# Patient Record
Sex: Female | Born: 1985 | Race: Black or African American | Hispanic: No | Marital: Married | Smoking: Never smoker
Health system: Southern US, Community
[De-identification: ages and names within clinical notes are randomized; demographics above are authoritative.]

## PROBLEM LIST (undated history)

## (undated) DIAGNOSIS — F32A Depression, unspecified: Secondary | ICD-10-CM

## (undated) DIAGNOSIS — E669 Obesity, unspecified: Secondary | ICD-10-CM

## (undated) DIAGNOSIS — B977 Papillomavirus as the cause of diseases classified elsewhere: Secondary | ICD-10-CM

## (undated) DIAGNOSIS — D649 Anemia, unspecified: Secondary | ICD-10-CM

## (undated) DIAGNOSIS — K219 Gastro-esophageal reflux disease without esophagitis: Secondary | ICD-10-CM

## (undated) DIAGNOSIS — E010 Iodine-deficiency related diffuse (endemic) goiter: Secondary | ICD-10-CM

## (undated) DIAGNOSIS — E039 Hypothyroidism, unspecified: Secondary | ICD-10-CM

## (undated) DIAGNOSIS — R87629 Unspecified abnormal cytological findings in specimens from vagina: Secondary | ICD-10-CM

## (undated) DIAGNOSIS — F419 Anxiety disorder, unspecified: Secondary | ICD-10-CM

## (undated) DIAGNOSIS — Z789 Other specified health status: Secondary | ICD-10-CM

## (undated) HISTORY — DX: Anxiety disorder, unspecified: F41.9

## (undated) HISTORY — DX: Gastro-esophageal reflux disease without esophagitis: K21.9

## (undated) HISTORY — DX: Depression, unspecified: F32.A

## (undated) HISTORY — PX: TUBAL LIGATION: SHX77

---

## 2015-04-25 ENCOUNTER — Inpatient Hospital Stay (HOSPITAL_COMMUNITY)
Admission: AD | Admit: 2015-04-25 | Discharge: 2015-04-26 | Disposition: A | Discharge status: Home / Self Care | Payer: Managed Care, Other (non HMO) | Source: Ambulatory Visit | Attending: Family Medicine | Admitting: Family Medicine

## 2015-04-25 ENCOUNTER — Encounter (HOSPITAL_COMMUNITY): Payer: Self-pay

## 2015-04-25 DIAGNOSIS — Z3202 Encounter for pregnancy test, result negative: Secondary | ICD-10-CM | POA: Insufficient documentation

## 2015-04-25 DIAGNOSIS — S46912A Strain of unspecified muscle, fascia and tendon at shoulder and upper arm level, left arm, initial encounter: Secondary | ICD-10-CM | POA: Diagnosis not present

## 2015-04-25 DIAGNOSIS — A084 Viral intestinal infection, unspecified: Secondary | ICD-10-CM | POA: Diagnosis not present

## 2015-04-25 DIAGNOSIS — R112 Nausea with vomiting, unspecified: Secondary | ICD-10-CM | POA: Diagnosis present

## 2015-04-25 HISTORY — DX: Other specified health status: Z78.9

## 2015-04-25 LAB — URINALYSIS, ROUTINE W REFLEX MICROSCOPIC
BILIRUBIN URINE: NEGATIVE
GLUCOSE, UA: NEGATIVE mg/dL
HGB URINE DIPSTICK: NEGATIVE
KETONES UR: NEGATIVE mg/dL
Nitrite: NEGATIVE
PROTEIN: NEGATIVE mg/dL
Specific Gravity, Urine: 1.02 (ref 1.005–1.030)
pH: 6 (ref 5.0–8.0)

## 2015-04-25 LAB — URINE MICROSCOPIC-ADD ON: RBC / HPF: NONE SEEN RBC/hpf (ref 0–5)

## 2015-04-25 LAB — POCT PREGNANCY, URINE: Preg Test, Ur: NEGATIVE

## 2015-04-25 MED ORDER — ONDANSETRON 8 MG PO TBDP
8.0000 mg | ORAL_TABLET | Freq: Once | ORAL | Status: AC
  Administered 2015-04-25: 8 mg via ORAL
  Filled 2015-04-25: qty 1

## 2015-04-25 MED ORDER — ONDANSETRON HCL 4 MG PO TABS: 4.0000 mg | ORAL_TABLET | Freq: Four times a day (QID) | ORAL | Status: DC

## 2015-04-25 NOTE — MAU Note (Signed)
Pt reports nausea and vomiting today, diarrhea last pm. Also reports her left shoulder was numb earlier today.

## 2015-04-25 NOTE — MAU Provider Note (Signed)
History     CSN: [id number]  Arrival date and time: 04/25/15 2250   First Provider Initiated Contact with Patient 04/25/15 2327      Chief Complaint  Patient presents with  . Nausea  . Emesis   HPI Ms. Kimberly Dennis is a 30 y.o. G2P1011 who presents to MAU today with complaint of N/V/D since last night. She states last loose stool was this morning and last emesis around 1800 today. She has not eaten since then. She denies abdominal pain or vaginal bleeding. She denies fever or sick contacts. She also complains of mild left shoulder pain since this morning. She has not taken anything for pain. She rates pain at 3/10 now.   OB History    Gravida Para Term Preterm AB TAB SAB Ectopic Multiple Living   2 1 1  1  1   1       Past Medical History  Diagnosis Date  . Medical history non-contributory     Past Surgical History  Procedure Laterality Date  . Cesarean section      No family history on file.  Social History  Substance Use Topics  . Smoking status: None  . Smokeless tobacco: None  . Alcohol Use: None    Allergies: No Known Allergies  No prescriptions prior to admission    Review of Systems  Constitutional: Negative for fever and malaise/fatigue.  Gastrointestinal: Positive for nausea, vomiting and diarrhea. Negative for abdominal pain and constipation.  Genitourinary: Negative for dysuria, urgency and frequency.       Neg - vaginal bleeding  Musculoskeletal: Positive for myalgias.   Physical Exam   Blood pressure 133/81, pulse 99, temperature 99.1 F (37.3 C), temperature source Oral, resp. rate 18, height 5\' 8"  (1.727 m), weight 267 lb (121.11 kg), last menstrual period 04/15/2015, SpO2 100 %.  Physical Exam  Nursing note and vitals reviewed. Constitutional: She is oriented to person, place, and time. She appears well-developed and well-nourished. No distress.  HENT:  Head: Normocephalic and atraumatic.  Cardiovascular: Normal rate.   Respiratory:  Effort normal.  GI: Soft. She exhibits no distension.  Musculoskeletal:       Left shoulder: She exhibits tenderness (mild tenderness to palpation over the bicipital groove). She exhibits normal range of motion, no bony tenderness, no swelling, no effusion, no crepitus, no deformity, no pain and no spasm.  Neurological: She is alert and oriented to person, place, and time.  Skin: Skin is warm and dry. No erythema.  Psychiatric: She has a normal mood and affect.    Results for orders placed or performed during the hospital encounter of 04/25/15 (from the past 24 hour(s))  Urinalysis, Routine w reflex microscopic (not at [facility])     Status: Abnormal   Collection Time: 04/25/15 11:10 PM  Result Value Ref Range   Color, Urine YELLOW YELLOW   APPearance CLEAR CLEAR   Specific Gravity, Urine 1.020 1.005 - 1.030   pH 6.0 5.0 - 8.0   Glucose, UA NEGATIVE NEGATIVE mg/dL   Hgb urine dipstick NEGATIVE NEGATIVE   Bilirubin Urine NEGATIVE NEGATIVE   Ketones, ur NEGATIVE NEGATIVE mg/dL   Protein, ur NEGATIVE NEGATIVE mg/dL   Nitrite NEGATIVE NEGATIVE   Leukocytes, UA SMALL (A) NEGATIVE  Urine microscopic-add on     Status: Abnormal   Collection Time: 04/25/15 11:10 PM  Result Value Ref Range   Squamous Epithelial / LPF 6-30 (A) NONE SEEN   WBC, UA 0-5 0 - 5 WBC/hpf  RBC / HPF NONE SEEN 0 - 5 RBC/hpf   Bacteria, UA MANY (A) NONE SEEN  Pregnancy, urine POC     Status: None   Collection Time: 04/25/15 11:20 PM  Result Value Ref Range   Preg Test, Ur NEGATIVE NEGATIVE    MAU Course  Procedures None  MDM UPT - negative UA today without evidence of dehydration ODT Zofran given in MAU Urine culture pending Assessment and Plan  A: Viral gastroenteritis Left shoulder strain  P: Discharge home Rx for Zofran given to patient Advised use of Ibuprofen, ice and rest for left shoulder pain. Follow-up with PCP if symptoms do not improve.  Warning signs for worsening condition  discussed Patient advised to follow-up with PCP of choice if symptoms persist or worsen Patient may return to MAU as needed or if her condition were to change or worsen   Marny LowensteinJulie N Wenzel, PA-C  04/25/2015, 11:39 PM

## 2015-04-25 NOTE — Discharge Instructions (Signed)
Food Choices to Help Relieve Diarrhea, Adult °When you have diarrhea, the foods you eat and your eating habits are very important. Choosing the right foods and drinks can help relieve diarrhea. Also, because diarrhea can last up to 7 days, you need to replace lost fluids and electrolytes (such as sodium, potassium, and chloride) in order to help prevent dehydration.  °WHAT GENERAL GUIDELINES DO I NEED TO FOLLOW? °· Slowly drink 1 cup (8 oz) of fluid for each episode of diarrhea. If you are getting enough fluid, your urine will be clear or pale yellow. °· Eat starchy foods. Some good choices include white rice, white toast, pasta, low-fiber cereal, baked potatoes (without the skin), saltine crackers, and bagels. °· Avoid large servings of any cooked vegetables. °· Limit fruit to two servings per day. A serving is ½ cup or 1 small piece. °· Choose foods with less than 2 g of fiber per serving. °· Limit fats to less than 8 tsp (38 g) per day. °· Avoid fried foods. °· Eat foods that have probiotics in them. Probiotics can be found in certain dairy products. °· Avoid foods and beverages that may increase the speed at which food moves through the stomach and intestines (gastrointestinal tract). Things to avoid include: °¨ High-fiber foods, such as dried fruit, raw fruits and vegetables, nuts, seeds, and whole grain foods. °¨ Spicy foods and high-fat foods. °¨ Foods and beverages sweetened with high-fructose corn syrup, honey, or sugar alcohols such as xylitol, sorbitol, and mannitol. °WHAT FOODS ARE RECOMMENDED? °Grains °White rice. White, French, or pita breads (fresh or toasted), including plain rolls, buns, or bagels. White pasta. Saltine, soda, or graham crackers. Pretzels. Low-fiber cereal. Cooked cereals made with water (such as cornmeal, farina, or cream cereals). Plain muffins. Matzo. Melba toast. Zwieback.  °Vegetables °Potatoes (without the skin). Strained tomato and vegetable juices. Most well-cooked and canned  vegetables without seeds. Tender lettuce. °Fruits °Cooked or canned applesauce, apricots, cherries, fruit cocktail, grapefruit, peaches, pears, or plums. Fresh bananas, apples without skin, cherries, grapes, cantaloupe, grapefruit, peaches, oranges, or plums.  °Meat and Other Protein Products °Baked or boiled chicken. Eggs. Tofu. Fish. Seafood. Smooth peanut butter. Ground or well-cooked tender beef, ham, veal, lamb, pork, or poultry.  °Dairy °Plain yogurt, kefir, and unsweetened liquid yogurt. Lactose-free milk, buttermilk, or soy milk. Plain hard cheese. °Beverages °Sport drinks. Clear broths. Diluted fruit juices (except prune). Regular, caffeine-free sodas such as ginger ale. Water. Decaffeinated teas. Oral rehydration solutions. Sugar-free beverages not sweetened with sugar alcohols. °Other °Bouillon, broth, or soups made from recommended foods.  °The items listed above may not be a complete list of recommended foods or beverages. Contact your dietitian for more options. °WHAT FOODS ARE NOT RECOMMENDED? °Grains °Whole grain, whole wheat, bran, or rye breads, rolls, pastas, crackers, and cereals. Wild or brown rice. Cereals that contain more than 2 g of fiber per serving. Corn tortillas or taco shells. Cooked or dry oatmeal. Granola. Popcorn. °Vegetables °Raw vegetables. Cabbage, broccoli, Brussels sprouts, artichokes, baked beans, beet greens, corn, kale, legumes, peas, sweet potatoes, and yams. Potato skins. Cooked spinach and cabbage. °Fruits °Dried fruit, including raisins and dates. Raw fruits. Stewed or dried prunes. Fresh apples with skin, apricots, mangoes, pears, raspberries, and strawberries.  °Meat and Other Protein Products °Chunky peanut butter. Nuts and seeds. Beans and lentils. Bacon.  °Dairy °High-fat cheeses. Milk, chocolate milk, and beverages made with milk, such as milk shakes. Cream. Ice cream. °Sweets and Desserts °Sweet rolls, doughnuts, and sweet breads.   Pancakes and waffles. Fats and  Oils Butter. Cream sauces. Margarine. Salad oils. Plain salad dressings. Olives. Avocados.  Beverages Caffeinated beverages (such as coffee, tea, soda, or energy drinks). Alcoholic beverages. Fruit juices with pulp. Prune juice. Soft drinks sweetened with high-fructose corn syrup or sugar alcohols. Other Coconut. Hot sauce. Chili powder. Mayonnaise. Gravy. Cream-based or milk-based soups.  The items listed above may not be a complete list of foods and beverages to avoid. Contact your dietitian for more information. WHAT SHOULD I DO IF I BECOME DEHYDRATED? Diarrhea can sometimes lead to dehydration. Signs of dehydration include dark urine and dry mouth and skin. If you think you are dehydrated, you should rehydrate with an oral rehydration solution. These solutions can be purchased at pharmacies, retail stores, or online.  Drink -1 cup (120-240 mL) of oral rehydration solution each time you have an episode of diarrhea. If drinking this amount makes your diarrhea worse, try drinking smaller amounts more often. For example, drink 1-3 tsp (5-15 mL) every 5-10 minutes.  A general rule for staying hydrated is to drink 1-2 L of fluid per day. Talk to your health care provider about the specific amount you should be drinking each day. Drink enough fluids to keep your urine clear or pale yellow.   This information is not intended to replace advice given to you by your health care provider. Make sure you discuss any questions you have with your health care provider.   Document Released: 06/30/2003 Document Revised: 04/30/2014 Document Reviewed: 03/02/2013 Elsevier Interactive Patient Education 2016 Elsevier Inc. Muscle Strain A muscle strain (pulled muscle) happens when a muscle is stretched beyond normal length. It happens when a sudden, violent force stretches your muscle too far. Usually, a few of the fibers in your muscle are torn. Muscle strain is common in athletes. Recovery usually takes 1-2 weeks.  Complete healing takes 5-6 weeks.  HOME CARE   Follow the PRICE method of treatment to help your injury get better. Do this the first 2-3 days after the injury:  Protect. Protect the muscle to keep it from getting injured again.  Rest. Limit your activity and rest the injured body part.  Ice. Put ice in a plastic bag. Place a towel between your skin and the bag. Then, apply the ice and leave it on from 15-20 minutes each hour. After the third day, switch to moist heat packs.  Compression. Use a splint or elastic bandage on the injured area for comfort. Do not put it on too tightly.  Elevate. Keep the injured body part above the level of your heart.  Only take medicine as told by your doctor.  Warm up before doing exercise to prevent future muscle strains. GET HELP IF:   You have more pain or puffiness (swelling) in the injured area.  You feel numbness, tingling, or notice a loss of strength in the injured area. MAKE SURE YOU:   Understand these instructions.  Will watch your condition.  Will get help right away if you are not doing well or get worse.   This information is not intended to replace advice given to you by your health care provider. Make sure you discuss any questions you have with your health care provider.   Document Released: 01/17/2008 Document Revised: 01/28/2013 Document Reviewed: 11/06/2012 Elsevier Interactive Patient Education [employer] Elsevier Inc.

## 2015-04-27 LAB — CULTURE, OB URINE

## 2015-05-25 ENCOUNTER — Ambulatory Visit (INDEPENDENT_AMBULATORY_CARE_PROVIDER_SITE_OTHER): Payer: Managed Care, Other (non HMO)

## 2015-05-25 ENCOUNTER — Ambulatory Visit (INDEPENDENT_AMBULATORY_CARE_PROVIDER_SITE_OTHER): Payer: Managed Care, Other (non HMO) | Admitting: Physician Assistant

## 2015-05-25 VITALS — BP 110/60 | HR 87 | Temp 99.4°F | Resp 18 | Ht 66.75 in | Wt 266.8 lb

## 2015-05-25 DIAGNOSIS — R05 Cough: Secondary | ICD-10-CM

## 2015-05-25 DIAGNOSIS — J111 Influenza due to unidentified influenza virus with other respiratory manifestations: Secondary | ICD-10-CM | POA: Diagnosis not present

## 2015-05-25 DIAGNOSIS — R059 Cough, unspecified: Secondary | ICD-10-CM

## 2015-05-25 DIAGNOSIS — R69 Illness, unspecified: Secondary | ICD-10-CM

## 2015-05-25 LAB — POCT CBC
Granulocyte percent: 72.7 %G (ref 37–80)
HEMATOCRIT: 36.5 % — AB (ref 37.7–47.9)
HEMOGLOBIN: 12 g/dL — AB (ref 12.2–16.2)
LYMPH, POC: 1.5 (ref 0.6–3.4)
MCH: 27.2 pg (ref 27–31.2)
MCHC: 32.9 g/dL (ref 31.8–35.4)
MCV: 82.5 fL (ref 80–97)
MID (cbc): 0.6 (ref 0–0.9)
MPV: 7 fL (ref 0–99.8)
POC GRANULOCYTE: 5.5 (ref 2–6.9)
POC LYMPH PERCENT: 19.7 %L (ref 10–50)
POC MID %: 7.6 %M (ref 0–12)
Platelet Count, POC: 201 10*3/uL (ref 142–424)
RBC: 4.42 M/uL (ref 4.04–5.48)
RDW, POC: 15.6 %
WBC: 7.6 10*3/uL (ref 4.6–10.2)

## 2015-05-25 MED ORDER — OSELTAMIVIR PHOSPHATE 75 MG PO CAPS: 75.0000 mg | ORAL_CAPSULE | Freq: Two times a day (BID) | ORAL | Status: DC

## 2015-05-25 MED ORDER — HYDROCODONE-HOMATROPINE 5-1.5 MG/5ML PO SYRP: 2.5000 mL | ORAL_SOLUTION | Freq: Every evening | ORAL | Status: DC | PRN

## 2015-05-25 MED ORDER — IBUPROFEN 600 MG PO TABS: 600.0000 mg | ORAL_TABLET | Freq: Three times a day (TID) | ORAL | Status: DC | PRN

## 2015-05-25 MED ORDER — LORATADINE-PSEUDOEPHEDRINE ER 5-120 MG PO TB12: 1.0000 | ORAL_TABLET | Freq: Two times a day (BID) | ORAL | Status: DC

## 2015-05-25 NOTE — Progress Notes (Signed)
05/26/2015 12:53 PM   DOB: 1985/12/18 / MRN: [id number]  SUBJECTIVE:  Kimberly Dennis is a 30 y.o. female presenting for productive cough and fever that started yesterday.  Associates some nasal congestion.  Has not tried any meds yet.  Reports that her mother has been recently diagnosed with pneumonia.    She has No Known Allergies.   She  has a past medical history of Medical history non-contributory.    She   She  has no sexual activity history on file. The patient  has past surgical history that includes Cesarean section.  Her family history is not on file.  Review of Systems  Constitutional: Positive for fever and chills. Negative for diaphoresis.  Respiratory: Positive for cough, hemoptysis, sputum production and shortness of breath. Negative for wheezing.   Cardiovascular: Negative for chest pain and leg swelling.  Gastrointestinal: Negative for nausea.  Genitourinary: Negative for dysuria.  Musculoskeletal: Negative for myalgias.  Skin: Negative for rash.  Neurological: Negative for dizziness and headaches.    Problem list and medications reviewed and updated by myself where necessary, and exist elsewhere in the encounter.   OBJECTIVE:  BP 110/60 mmHg  Pulse 87  Temp(Src) 99.4 F (37.4 C) (Oral)  Resp 18  Ht 5' 6.75" (1.695 m)  Wt 266 lb 12.8 oz (121.02 kg)  BMI 42.12 kg/m2  SpO2 98%  LMP 04/15/2015    Physical Exam  Constitutional: She is oriented to person, place, and time. She appears well-developed.  Eyes: EOM are normal. Pupils are equal, round, and reactive to light.  Cardiovascular: Normal rate.   Pulmonary/Chest: Effort normal. No respiratory distress. She has no wheezes. She has no rales. She exhibits no tenderness.  Abdominal: She exhibits no distension.  Musculoskeletal: Normal range of motion.  Neurological: She is alert and oriented to person, place, and time. No cranial nerve deficit.  Skin: Skin is warm and dry. She is not diaphoretic.    Psychiatric: She has a normal mood and affect.  Vitals reviewed.   Results for orders placed or performed in visit on 05/25/15 (from the past 72 hour(s))  POCT CBC     Status: Abnormal   Collection Time: 05/25/15  5:34 PM  Result Value Ref Range   WBC 7.6 4.6 - 10.2 K/uL   Lymph, poc 1.5 0.6 - 3.4   POC LYMPH PERCENT 19.7 10 - 50 %L   MID (cbc) 0.6 0 - 0.9   POC MID % 7.6 0 - 12 %M   POC Granulocyte 5.5 2 - 6.9   Granulocyte percent 72.7 37 - 80 %G   RBC 4.42 4.04 - 5.48 M/uL   Hemoglobin 12.0 (A) 12.2 - 16.2 g/dL   HCT, POC 95.6 (A) 21.3 - 47.9 %   MCV 82.5 80 - 97 fL   MCH, POC 27.2 27 - 31.2 pg   MCHC 32.9 31.8 - 35.4 g/dL   RDW, POC 08.6 %   Platelet Count, POC 201 142 - 424 K/uL   MPV 7.0 0 - 99.8 fL    Dg Chest 2 View  05/25/2015  CLINICAL DATA:  30 year old female with history of productive cough and fever since yesterday. Some nasal congestion. EXAM: CHEST  2 VIEW COMPARISON:  No priors. FINDINGS: Lung volumes are normal. No consolidative airspace disease. No pleural effusions. No pneumothorax. No pulmonary nodule or mass noted. Pulmonary vasculature and the cardiomediastinal silhouette are within normal limits. IMPRESSION: No radiographic evidence of acute cardiopulmonary disease. Electronically Signed  By: Trudie Reed M.D.   On: 05/25/2015 17:49    Assessment & Plan  Kimberly Dennis was seen today for cough, generalized body aches, chills and fever.  Diagnoses and all orders for this visit:  Cough -     DG Chest 2 View; Future -     POCT CBC -     HYDROcodone-homatropine (HYCODAN) 5-1.5 MG/5ML syrup; Take 2.5-5 mLs by mouth at bedtime as needed.  Influenza-like illness -     ibuprofen (ADVIL,MOTRIN) 600 MG tablet; Take 1 tablet (600 mg total) by mouth every 8 (eight) hours as needed. -     loratadine-pseudoephedrine (CLARITIN-D 12 HOUR) 5-120 MG tablet; Take 1 tablet by mouth 2 (two) times daily. -     oseltamivir (TAMIFLU) 75 MG capsule; Take 1 capsule (75 mg  total) by mouth 2 (two) times daily.  Other orders -     Discontinue: oseltamivir (TAMIFLU) 75 MG capsule; Take 1 capsule (75 mg total) by mouth 2 (two) times daily.    The patient was advised to call or return to clinic if she does not see an improvement in symptoms or to seek the care of the closest emergency department if she worsens with the above plan.   Deliah Boston, MHS, PA-C [facility] 05/26/2015 12:53 PM

## 2015-05-25 NOTE — Patient Instructions (Signed)
Because you received an x-ray today, you will receive an invoice from [facility]. Please contact [facility] at [phone] with questions or concerns regarding your invoice. Our billing staff will not be able to assist you with those questions. °

## 2017-07-02 ENCOUNTER — Other Ambulatory Visit (HOSPITAL_COMMUNITY): Payer: Self-pay | Admitting: General Surgery

## 2017-07-08 ENCOUNTER — Ambulatory Visit (HOSPITAL_COMMUNITY): Payer: Managed Care, Other (non HMO)

## 2017-07-11 ENCOUNTER — Encounter: Payer: Managed Care, Other (non HMO) | Attending: General Surgery | Admitting: Skilled Nursing Facility1

## 2017-07-11 ENCOUNTER — Encounter: Payer: Self-pay | Admitting: Skilled Nursing Facility1

## 2017-07-11 DIAGNOSIS — Z833 Family history of diabetes mellitus: Secondary | ICD-10-CM | POA: Diagnosis not present

## 2017-07-11 DIAGNOSIS — Z6841 Body Mass Index (BMI) 40.0 and over, adult: Secondary | ICD-10-CM | POA: Insufficient documentation

## 2017-07-11 DIAGNOSIS — Z713 Dietary counseling and surveillance: Secondary | ICD-10-CM | POA: Insufficient documentation

## 2017-07-11 DIAGNOSIS — Z8249 Family history of ischemic heart disease and other diseases of the circulatory system: Secondary | ICD-10-CM | POA: Diagnosis not present

## 2017-07-11 DIAGNOSIS — E669 Obesity, unspecified: Secondary | ICD-10-CM

## 2017-07-11 DIAGNOSIS — Z87891 Personal history of nicotine dependence: Secondary | ICD-10-CM | POA: Insufficient documentation

## 2017-07-11 NOTE — Progress Notes (Signed)
Pre-Op Assessment Visit:  Pre-Operative Sleeve Gastrectomy Surgery  Medical Nutrition Therapy:  Appt start time: 9:05  End time:  9:45  Patient was seen on 07/11/2017 for Pre-Operative Nutrition Assessment. Assessment and [facility] of [facility] coordinator on 07/11/2017.   Pt states she just wants To be healthy and not worry about weight. Pt states she works with [employer]. Pt states her younger sister had surgery. Pt understands she cannot gain weight due to her insurance coverage. Pt needs 3 SWL 4 total appointments.   Pt expectation of surgery: To be in pain, to lose weight quickly  Start weight at [facility]: 276.6 BMI: 45.19  24 hr Dietary Recall: First Meal: skipped Snack:  Second Meal 4pm: pizza Snack:  Third Meal 10:30pm: pasta Snack: salami and cheese Beverages: soda, water, juice, coffee with syrup and sugar  Encouraged to engage in 150 minutes of moderate physical activity including cardiovascular and weight baring weekly  Handouts given during visit include:  . Pre-Op Goals . Bariatric Surgery Protein Shakes During the appointment today the following Pre-Op Goals were reviewed with the patient: . Maintain or lose weight as instructed by your surgeon . Make healthy food choices . Begin to limit portion sizes . Limited concentrated sugars and fried foods . Keep fat/sugar in the single digits per serving on             food labels . Practice CHEWING your food  (aim for 30 chews per bite or until applesauce consistency) . Practice not drinking 15 minutes before, during, and 30 minutes after each meal/snack . Avoid all carbonated beverages  . Avoid/limit caffeinated beverages  . Avoid all sugar-sweetened beverages . Consume 3 meals per day; eat every 3-5 hours . Make a list of non-food related activities . Aim for 64-100 ounces of FLUID daily  . Aim for at least 60-80 grams of PROTEIN daily . Look for a liquid protein  source that contain ?15 g protein and ?5 g carbohydrate  (ex: shakes, drinks, shots)  -Follow diet recommendations listed below   Energy and Macronutrient Recomendations: Calories: 1500 Carbohydrate: 170 Protein: 112 Fat: 42  Demonstrated degree of understanding via:  Teach Back  Teaching Method Utilized:  Visual Auditory Hands on  Barriers to learning/adherence to lifestyle change: none identified   Patient to call the [facility] Education [facility] to enroll in Pre-Op and Post-Op Nutrition Education when surgery date is scheduled.

## 2017-07-12 ENCOUNTER — Ambulatory Visit (HOSPITAL_COMMUNITY)
Admission: RE | Admit: 2017-07-12 | Discharge: 2017-07-12 | Disposition: A | Discharge status: Home / Self Care | Payer: Managed Care, Other (non HMO) | Source: Ambulatory Visit | Attending: General Surgery | Admitting: General Surgery

## 2017-07-12 ENCOUNTER — Other Ambulatory Visit: Payer: Self-pay

## 2017-08-14 ENCOUNTER — Ambulatory Visit: Payer: Self-pay | Admitting: Skilled Nursing Facility1

## 2017-08-14 ENCOUNTER — Encounter: Payer: Self-pay | Admitting: Registered"

## 2017-08-14 ENCOUNTER — Encounter: Payer: Managed Care, Other (non HMO) | Attending: General Surgery | Admitting: Registered"

## 2017-08-14 DIAGNOSIS — Z833 Family history of diabetes mellitus: Secondary | ICD-10-CM | POA: Insufficient documentation

## 2017-08-14 DIAGNOSIS — E669 Obesity, unspecified: Secondary | ICD-10-CM

## 2017-08-14 DIAGNOSIS — Z713 Dietary counseling and surveillance: Secondary | ICD-10-CM | POA: Diagnosis not present

## 2017-08-14 DIAGNOSIS — Z8249 Family history of ischemic heart disease and other diseases of the circulatory system: Secondary | ICD-10-CM | POA: Diagnosis not present

## 2017-08-14 DIAGNOSIS — Z6841 Body Mass Index (BMI) 40.0 and over, adult: Secondary | ICD-10-CM | POA: Diagnosis not present

## 2017-08-14 DIAGNOSIS — Z87891 Personal history of nicotine dependence: Secondary | ICD-10-CM | POA: Diagnosis not present

## 2017-08-14 NOTE — Patient Instructions (Addendum)
-   Continue to work on chewing at least 30 times per bite.   - Aim to have breakfast option when waking up at 11 am such as egg and toast.   - Decrease juice intake and replace with water + flavor packs, gatorade zero, powerade zero, Nature's Twist, Vitamin Water zero, etc.  - Aim for at least 64 ounces a day.

## 2017-08-14 NOTE — Progress Notes (Signed)
Sleeve Gastrectomy Appt start time: 8:45 end time: 9:05  Assessment: 1st SWL Appointment.   Start Wt at [facility]: 276.6 Wt: 279.3 BMI: 45.63   Pt arrives having gained about 2.7 lbs from previous visit.   Pt states she has been able to eliminate soda (Coke, Pepsi) and caffeine intake. Pt states she is still working on chewing at least 30 times per bite. Pt states she is doing well with not drinking around meals. Pt states she wakes up at 6:30 am to take daughter to school and returns home to sleep again. Pt states she wakes up to start her day at 11am, works 12:30-9:15pm, eats dinner around 9:45pm. Pt states she typically goes to bed at 3am. 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    MEDICATIONS: See list   DIETARY INTAKE:  24-hr recall:  B ( AM): typically skips  Snk ( AM): none  L (4 PM): salad, pizza or veggie wrap Snk ( PM): none D (9:45 PM): steak, salad Snk ( PM): sometimes chips or popcorn Beverages: juice (48 oz), water (40 oz)  Usual physical activity: playing tennis 2 days/week, treadmill 30 min, 5 days/week  Diet to Follow: 1500 calories 170 g carbohydrates 112 g protein 42 g fat  Preferred Learning Style:   No preference indicated   Learning Readiness:   Ready  Change in progress    Nutritional Diagnosis:  [facility]-3.3 Overweight/obesity related to past poor dietary habits and physical inactivity as evidenced by patient w/ planned sleeve gastrectomy surgery following dietary guidelines for continued weight loss.    Intervention:  Nutrition counseling for upcoming Bariatric Surgery.  Goals:  - Aim for 150 minutes of physical activity including cardio and weight bearing every week - Continue to work on chewing at least 30 times per bite.  - Aim to have breakfast option when waking up at 11 am such as egg and toast.  - Decrease juice intake and replace with water + flavor packs, gatorade zero, powerade  zero, Nature's Twist, Vitamin Water zero, etc. - Aim for at least 64 ounces a day.   Teaching Method Utilized:  Visual Auditory Hands on  Handouts given during visit include:  none  Barriers to learning/adherence to lifestyle change: none identified  Demonstrated degree of understanding via:  Teach Back   Monitoring/Evaluation:  Dietary intake, exercise, and body weight in 1 month(s).

## 2017-08-28 ENCOUNTER — Other Ambulatory Visit (HOSPITAL_COMMUNITY)
Admission: RE | Admit: 2017-08-28 | Discharge: 2017-08-28 | Disposition: A | Discharge status: Home / Self Care | Payer: Managed Care, Other (non HMO) | Source: Ambulatory Visit | Attending: Family Medicine | Admitting: Family Medicine

## 2017-08-28 ENCOUNTER — Other Ambulatory Visit: Payer: Self-pay | Admitting: Family Medicine

## 2017-08-28 DIAGNOSIS — Z01419 Encounter for gynecological examination (general) (routine) without abnormal findings: Secondary | ICD-10-CM | POA: Diagnosis present

## 2017-08-30 LAB — CYTOLOGY - PAP
DIAGNOSIS: NEGATIVE
HPV (WINDOPATH): DETECTED — AB

## 2017-09-06 ENCOUNTER — Encounter: Payer: Managed Care, Other (non HMO) | Attending: General Surgery | Admitting: Registered"

## 2017-09-06 ENCOUNTER — Encounter: Payer: Self-pay | Admitting: Registered"

## 2017-09-06 DIAGNOSIS — E669 Obesity, unspecified: Secondary | ICD-10-CM

## 2017-09-06 DIAGNOSIS — Z833 Family history of diabetes mellitus: Secondary | ICD-10-CM | POA: Insufficient documentation

## 2017-09-06 DIAGNOSIS — Z8249 Family history of ischemic heart disease and other diseases of the circulatory system: Secondary | ICD-10-CM | POA: Insufficient documentation

## 2017-09-06 DIAGNOSIS — Z6841 Body Mass Index (BMI) 40.0 and over, adult: Secondary | ICD-10-CM | POA: Insufficient documentation

## 2017-09-06 DIAGNOSIS — Z87891 Personal history of nicotine dependence: Secondary | ICD-10-CM | POA: Insufficient documentation

## 2017-09-06 DIAGNOSIS — Z713 Dietary counseling and surveillance: Secondary | ICD-10-CM | POA: Diagnosis present

## 2017-09-06 NOTE — Patient Instructions (Addendum)
-   Continue to work on chewing well.   - Aim to not drink 15 minutes before eating, not while eating, and waiting 30 minutes after eating to drink.   - Continue to keep up the great work with other habits already established.

## 2017-09-06 NOTE — Progress Notes (Signed)
Sleeve Gastrectomy Appt start time: 9:25 end time: 9:37  Assessment: 2nd SWL Appointment.   Start Wt at [facility]: 276.6 Wt: 279.3 BMI: 45.63   Pt arrives having gained about 2.7 lbs from previous visit.   Pt states she is getting better with chewing; averaging 20-25 chews per bite. Pt states she has added breakfast option, now eating 3 meals a day. Pt states she tried Nature's Twist and it was too sweet. Pt states she is drinking Gatorade and water. Pt states she is drinking at least 64 ounces a day; no longer drinks juice. Pt states she likes the Muscle Milk light (vanilla flavor) and Premier Protein shakes (caramel and vanilla). Pt is doing well making behavioral changes.   Pt states she is doing well with not drinking around meals. Pt states she wakes up at 6:30 am to take daughter to school and returns home to sleep again. Pt states she wakes up to start her day at 11am, works 12:30-9:15pm, eats dinner around 9:45pm. Pt states she typically goes to bed at 3am. 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   Pt needs 3 SWL 4 total appointments.   MEDICATIONS: See list   DIETARY INTAKE:  24-hr recall:  B ( AM): wheat thins + egg/sausage Snk ( AM): none  L (4 PM): salad, pizza or veggie wrap Snk ( PM): protein shake D (9:45 PM): steak, salad Snk ( PM): sometimes chips or popcorn Beverages: Gatorade, Propel, water (40 oz)  Usual physical activity: playing tennis 2 days/week, treadmill 30 min, 5 days/week  Diet to Follow: 1500 calories 170 g carbohydrates 112 g protein 42 g fat  Preferred Learning Style:   No preference indicated   Learning Readiness:   Ready  Change in progress    Nutritional Diagnosis:  [place]-3.3 Overweight/obesity related to past poor dietary habits and physical inactivity as evidenced by patient w/ planned sleeve gastrectomy surgery following dietary guidelines for continued weight loss.     Intervention:  Nutrition counseling for upcoming Bariatric Surgery.  Goals:  - Aim for 150 minutes of physical activity including cardio and weight bearing every week - Continue to work on chewing well.  - Aim to not drink 15 minutes before eating, not while eating, and waiting 30 minutes after eating to drink.  - Continue to keep up the great work with other habits already established. day.   Teaching Method Utilized:  Visual Auditory Hands on  Handouts given during visit include:  none  Barriers to learning/adherence to lifestyle change: none identified  Demonstrated degree of understanding via:  Teach Back   Monitoring/Evaluation:  Dietary intake, exercise, and body weight in 1 month(s).

## 2017-10-04 ENCOUNTER — Ambulatory Visit: Payer: Self-pay | Admitting: Registered"

## 2017-10-09 ENCOUNTER — Encounter: Payer: Managed Care, Other (non HMO) | Attending: General Surgery | Admitting: Registered"

## 2017-10-09 ENCOUNTER — Encounter: Payer: Self-pay | Admitting: Registered"

## 2017-10-09 DIAGNOSIS — Z833 Family history of diabetes mellitus: Secondary | ICD-10-CM | POA: Insufficient documentation

## 2017-10-09 DIAGNOSIS — E669 Obesity, unspecified: Secondary | ICD-10-CM

## 2017-10-09 DIAGNOSIS — Z6841 Body Mass Index (BMI) 40.0 and over, adult: Secondary | ICD-10-CM | POA: Insufficient documentation

## 2017-10-09 DIAGNOSIS — Z87891 Personal history of nicotine dependence: Secondary | ICD-10-CM | POA: Diagnosis not present

## 2017-10-09 DIAGNOSIS — Z8249 Family history of ischemic heart disease and other diseases of the circulatory system: Secondary | ICD-10-CM | POA: Diagnosis not present

## 2017-10-09 DIAGNOSIS — Z713 Dietary counseling and surveillance: Secondary | ICD-10-CM | POA: Diagnosis not present

## 2017-10-09 NOTE — Progress Notes (Signed)
Sleeve Gastrectomy Appt start time: 8:00 End time: 8:18  Assessment: 3rd SWL Appointment.   Start Wt at [facility]: 276.6 Wt: 281.0 BMI: 45.91   Pt arrives having maintained weight from previous visit.   Pt states she has been stressed lately and has not been drinking as much as she used to; plan to get back on track with fluids. Pt states she is doing well with chewing 30 times per bite. Pt states she drinks protein shake every 2 hours because she feels she is not getting enough protein with food. Pt states her sister is main cook in the home and she is currently cooking vegan/pescatarian options. Pt states she is only off 2 days/week and able to cook her own food on those days only. Pt states she and sister live together. Pt prefers convenient foods.   Pt states she is drinking Gatorade and water. Pt states she likes the Muscle Milk light (vanilla flavor) and Premier Protein shakes (caramel and vanilla). Pt is doing well making behavioral changes.   Pt states she is doing well with not drinking around meals. Pt states she wakes up at 6:30 am to take daughter to school and returns home to sleep again. Pt states she wakes up to start her day at 11am, works 12:30-9:15pm, eats dinner around 9:45pm. Pt states she typically goes to bed at 3am. 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   Pt needs 3 SWL 4 total appointments.   MEDICATIONS: See list   DIETARY INTAKE:  24-hr recall:  B ( AM): cheese stick + egg/sausage Snk ( AM): protein shake L (4 PM): salami + cheese Snk ( PM): protein shake D (9:45 PM): protein shake, salmon + brown rice/quinoa Snk ( PM): sometimes chips or popcorn Beverages: Gatorade, Propel, water (40 oz)  Usual physical activity: playing tennis 2 days/week, treadmill 30 min, 5 days/week  Diet to Follow: 1500 calories 170 g carbohydrates 112 g protein 42 g fat  Preferred Learning Style:   No preference  indicated   Learning Readiness:   Ready  Change in progress    Nutritional Diagnosis:  [place]-3.3 Overweight/obesity related to past poor dietary habits and physical inactivity as evidenced by patient w/ planned sleeve gastrectomy surgery following dietary guidelines for continued weight loss.    Intervention:  Nutrition counseling for upcoming Bariatric Surgery. Pt was educated on counseled on the importance of planning ahead with meals and benefits of adding non-starchy vegetables to meals. Pt was also encouraged to know this is her journey and that it may be different in some ways as it relates to other family members that have had bariatric surgery.  Goals:  - Aim for 150 minutes of physical activity including cardio and weight bearing every week - Get back on track with drinking at least 64 ounces of fluid.  - Start trying to meal prep now to prepare for life after surgery. Prepare enough to last a couple of days.  - Add in non-starchy vegetables with lunch and dinner.  Teaching Method Utilized:  Visual Auditory Hands on  Handouts given during visit include:  none  Barriers to learning/adherence to lifestyle change: none identified  Demonstrated degree of understanding via:  Teach Back   Monitoring/Evaluation:  Dietary intake, exercise, and body weight prn.

## 2017-10-09 NOTE — Patient Instructions (Addendum)
-   Get back on track with drinking at least 64 ounces of fluid.   - Start trying to meal prep now to prepare for life after surgery. Prepare enough to last a couple of days.   - Add in non-starchy vegetables with lunch and dinner.

## 2017-10-22 ENCOUNTER — Ambulatory Visit: Payer: Managed Care, Other (non HMO) | Admitting: Psychiatry

## 2017-10-22 DIAGNOSIS — F509 Eating disorder, unspecified: Secondary | ICD-10-CM

## 2017-10-29 ENCOUNTER — Ambulatory Visit (INDEPENDENT_AMBULATORY_CARE_PROVIDER_SITE_OTHER): Payer: Managed Care, Other (non HMO) | Admitting: Psychiatry

## 2017-10-29 DIAGNOSIS — F509 Eating disorder, unspecified: Secondary | ICD-10-CM | POA: Diagnosis not present

## 2017-11-04 ENCOUNTER — Encounter: Payer: Managed Care, Other (non HMO) | Attending: General Surgery | Admitting: Registered"

## 2017-11-04 DIAGNOSIS — Z6841 Body Mass Index (BMI) 40.0 and over, adult: Secondary | ICD-10-CM | POA: Diagnosis not present

## 2017-11-04 DIAGNOSIS — Z713 Dietary counseling and surveillance: Secondary | ICD-10-CM | POA: Insufficient documentation

## 2017-11-04 DIAGNOSIS — Z833 Family history of diabetes mellitus: Secondary | ICD-10-CM | POA: Diagnosis not present

## 2017-11-04 DIAGNOSIS — Z8249 Family history of ischemic heart disease and other diseases of the circulatory system: Secondary | ICD-10-CM | POA: Diagnosis not present

## 2017-11-04 DIAGNOSIS — Z87891 Personal history of nicotine dependence: Secondary | ICD-10-CM | POA: Diagnosis not present

## 2017-11-04 DIAGNOSIS — E669 Obesity, unspecified: Secondary | ICD-10-CM

## 2017-11-05 NOTE — Progress Notes (Signed)
  Pre-Operative Nutrition Class:  Appt start time: 8:15   End time:  9:15.  Patient was seen on 11/04/2017 for Pre-Operative Bariatric Surgery Education at [facility].   Surgery date: TBD Surgery type: Sleeve Start weight at [facility]: 276.6 Weight today: 280.3   Samples given per MNT protocol. Patient educated on appropriate usage: Bariatric Advantage Multivitamin Lot # [id number] Exp: 01/2019  Bariatric Advantage Calcium Citrate Lot # [id number] Exp: 05/14/2019  Bariatric Advantage Calcium Citrate Lot # [id number] Exp: 08/22/2018  Premier Protein Shake Lot # [id number] Exp: 07/30/2018  The following the learning objectives were met by the patient during this course:  Identify Pre-Op Dietary Goals and will begin 2 weeks pre-operatively  Identify appropriate sources of fluids and proteins   State protein recommendations and appropriate sources pre and post-operatively  Identify Post-Operative Dietary Goals and will follow for 2 weeks post-operatively  Identify appropriate multivitamin and calcium sources  Describe the need for physical activity post-operatively and will follow MD recommendations  State when to call healthcare provider regarding medication questions or post-operative complications  Handouts given during class include:  Pre-Op Bariatric Surgery Diet Handout  Protein Shake Handout  Post-Op Bariatric Surgery Nutrition Handout  BELT Program Information Flyer  Support Group Information Flyer  [facility] Bariatric Supplements Price List  Follow-Up Plan: Patient will follow-up at [facility] 2 weeks post operatively for diet advancement per MD.

## 2018-01-07 ENCOUNTER — Telehealth: Payer: Self-pay | Admitting: Skilled Nursing Facility1

## 2018-01-07 NOTE — Pre-Procedure Instructions (Signed)
The following are in epic:  EKG, CXR, KUB 07/12/2017

## 2018-01-07 NOTE — Patient Instructions (Addendum)
Your procedure is scheduled on: Monday, Sept. 30, 2019   Surgery Time:  7:30AM-9:00AM   Report to [facility] Main  Entrance    Report to admitting at 5:30 AM   Call this number if you have problems the morning of surgery [phone]   NO SOLID FOOD AFTER 600 PM THE NIGHT BEFORE YOUR SURGERY. YOU MAY DRINK CLEAR FLUIDS UNTIL 4:30AM DAY OF SURGERY  CLEAR LIQUID DIET   Foods Allowed                                                                     Foods Excluded  Coffee and tea, regular and decaf                             liquids that you cannot  Plain Jell-O in any flavor                                             see through such as: Fruit ices (not with fruit pulp)                                     milk, soups, orange juice  Iced Popsicles                                    All solid food Carbonated beverages, regular and diet                                    Cranberry, grape and apple juices Sports drinks like Gatorade Lightly seasoned clear broth or consume(fat free) Sugar, honey syrup  Sample Menu Breakfast                                Lunch                                     Supper Cranberry juice                    Beef broth                            Chicken broth Jell-O                                     Grape juice                           Apple juice Coffee or tea  Jell-O                                      Popsicle                                                Coffee or tea                        Coffee or tea   Brush your teeth the morning of surgery.   Do NOT smoke after Midnight    MORNING OF SURGERY DRINK:  1SHAKE BEFORE YOU LEAVE HOME, DRINK ALL OF THE SHAKE AT ONE TIME.  THE SHAKE YOU DRINK BEFORE  YOU LEAVE HOME WILL BE THE LAST FLUIDS YOU DRINK BEFORE SURGERY.   Take these medicines the morning of surgery with A SIP OF WATER: NONE                               You may not have any metal on your body  including hair pins, jewelry, and body piercings             Do not wear make-up, lotions, powders, perfumes/cologne, or deodorant             Do not wear nail polish.  Do not shave  48 hours prior to surgery.               Do not bring valuables to the hospital. [place] IS NOT             RESPONSIBLE   FOR VALUABLES.   Contacts, dentures or bridgework may not be worn into surgery.   Leave suitcase in the car. After surgery it may be brought to your room.    Special Instructions: Bring a copy of your healthcare power of attorney and living will documents         the day of surgery if you haven't scanned them in  before.   PAIN IS EXPECTED AFTER SURGERY AND WILL NOT BE COMPLETELY ELIMINATED. AMBULATION AND TYLENOL WILL HELP REDUCE  INCISIONAL AND GAS PAIN. MOVEMENT IS KEY!   YOU ARE EXPECTED TO BE OUT OF BED WITHIN 4 HOURS OF ADMISSION TO YOUR PATIENT ROOM.   SITTING IN THE RECLINER THROUGHOUT THE DAY IS IMPORTANT FOR DRINKING FLUIDS AND MOVING GAS THROUGHOUT THE GI TRACT.   COMPRESSION STOCKINGS SHOULD BE WORN [facility] STAY UNLESS YOU ARE WALKING.    INCENTIVE SPIROMETER SHOULD BE USED EVERY HOUR WHILE AWAKE TO DECREASE POST-OPERATIVE COMPLICATIONS SUCH AS  PNEUMONIA.    WHEN DISCHARGED HOME, IT IS IMPORTANT TO CONTINUE TO WALK EVERY HOUR AND USE THE INCENTIVE SPIROMETER EVERY HOUR.               Please read over the following fact sheets you were given:  [facility] - Preparing for Surgery Before surgery, you can play an important role.  Because skin is not sterile, your skin needs to be as free of germs as possible.  You can reduce the number of germs on your skin by washing with CHG (chlorahexidine gluconate) soap before surgery.  CHG is an antiseptic cleaner which kills germs and bonds  with the skin to continue killing germs even after washing. Please DO NOT use if you have an allergy to CHG or antibacterial soaps.  If your skin becomes reddened/irritated stop  using the CHG and inform your nurse when you arrive at Short Stay. Do not shave (including legs and underarms) for at least 48 hours prior to the first CHG shower.  You may shave your face/neck.  Please follow these instructions carefully:  1.  Shower with CHG Soap the night before surgery and the  morning of surgery.  2.  If you choose to wash your hair, wash your hair first as usual with your normal  shampoo.  3.  After you shampoo, rinse your hair and body thoroughly to remove the shampoo.                             4.  Use CHG as you would any other liquid soap.  You can apply chg directly to the skin and wash.  Gently with a scrungie or clean washcloth.  5.  Apply the CHG Soap to your body ONLY FROM THE NECK DOWN.   Do   not use on face/ open                           Wound or open sores. Avoid contact with eyes, ears mouth and   genitals (private parts).                       Wash face,  Genitals (private parts) with your normal soap.             6.  Wash thoroughly, paying special attention to the area where your    surgery  will be performed.  7.  Thoroughly rinse your body with warm water from the neck down.  8.  DO NOT shower/wash with your normal soap after using and rinsing off the CHG Soap.                9.  Pat yourself dry with a clean towel.            10.  Wear clean pajamas.            11.  Place clean sheets on your bed the night of your first shower and do not  sleep with pets. Day of Surgery : Do not apply any lotions/deodorants the morning of surgery.  Please wear clean clothes to the hospital/surgery center.  FAILURE TO FOLLOW THESE INSTRUCTIONS MAY RESULT IN THE CANCELLATION OF YOUR SURGERY  PATIENT SIGNATURE_________________________________  NURSE SIGNATURE__________________________________  ________________________________________________________________________

## 2018-01-07 NOTE — Telephone Encounter (Signed)
Dietitian called pt to assess her understanding of the nutrition recommendations.   LVM 

## 2018-01-08 ENCOUNTER — Inpatient Hospital Stay (HOSPITAL_COMMUNITY)
Admission: RE | Admit: 2018-01-08 | Discharge: 2018-01-08 | Disposition: A | Discharge status: Home / Self Care | Payer: Self-pay | Source: Ambulatory Visit

## 2018-01-09 ENCOUNTER — Other Ambulatory Visit (HOSPITAL_COMMUNITY): Payer: Self-pay

## 2018-01-10 ENCOUNTER — Ambulatory Visit: Payer: Self-pay | Admitting: General Surgery

## 2018-01-16 ENCOUNTER — Other Ambulatory Visit: Payer: Self-pay

## 2018-01-16 ENCOUNTER — Encounter (HOSPITAL_COMMUNITY): Payer: Self-pay

## 2018-01-16 ENCOUNTER — Encounter (HOSPITAL_COMMUNITY)
Admission: RE | Admit: 2018-01-16 | Discharge: 2018-01-16 | Disposition: A | Discharge status: Home / Self Care | Payer: Managed Care, Other (non HMO) | Source: Ambulatory Visit | Attending: General Surgery | Admitting: General Surgery

## 2018-01-16 DIAGNOSIS — Z01812 Encounter for preprocedural laboratory examination: Secondary | ICD-10-CM | POA: Diagnosis present

## 2018-01-16 LAB — COMPREHENSIVE METABOLIC PANEL
ALBUMIN: 3.9 g/dL (ref 3.5–5.0)
ALT: 15 U/L (ref 0–44)
AST: 15 U/L (ref 15–41)
Alkaline Phosphatase: 50 U/L (ref 38–126)
Anion gap: 6 (ref 5–15)
BUN: 18 mg/dL (ref 6–20)
CHLORIDE: 109 mmol/L (ref 98–111)
CO2: 27 mmol/L (ref 22–32)
Calcium: 8.9 mg/dL (ref 8.9–10.3)
Creatinine, Ser: 0.9 mg/dL (ref 0.44–1.00)
GFR calc non Af Amer: >60 mL/min (ref >60)
GLUCOSE: 104 mg/dL — AB (ref 70–99)
Potassium: 3.8 mmol/L (ref 3.5–5.1)
SODIUM: 142 mmol/L (ref 135–145)
Total Bilirubin: 0.6 mg/dL (ref 0.3–1.2)
Total Protein: 7.2 g/dL (ref 6.5–8.1)

## 2018-01-16 LAB — CBC WITH DIFFERENTIAL/PLATELET
BASOS ABS: 0 10*3/uL (ref 0.0–0.1)
BASOS PCT: 0 %
EOS ABS: 0.1 10*3/uL (ref 0.0–0.7)
EOS PCT: 2 %
HCT: 36.2 % (ref 36.0–46.0)
Hemoglobin: 11.2 g/dL — ABNORMAL LOW (ref 12.0–15.0)
Lymphocytes Relative: 42 %
Lymphs Abs: 2.5 10*3/uL (ref 0.7–4.0)
MCH: 26.8 pg (ref 26.0–34.0)
MCHC: 30.9 g/dL (ref 30.0–36.0)
MCV: 86.6 fL (ref 78.0–100.0)
Monocytes Absolute: 0.6 10*3/uL (ref 0.1–1.0)
Monocytes Relative: 10 %
Neutro Abs: 2.7 10*3/uL (ref 1.7–7.7)
Neutrophils Relative %: 46 %
PLATELETS: 289 10*3/uL (ref 150–400)
RBC: 4.18 MIL/uL (ref 3.87–5.11)
RDW: 15.4 % (ref 11.5–15.5)
WBC: 5.9 10*3/uL (ref 4.0–10.5)

## 2018-01-16 LAB — ABO/RH: ABO/RH(D): A POS

## 2018-01-16 NOTE — Patient Instructions (Addendum)
Kimberly Dennis  01/16/2018   Your procedure is scheduled on: 01-20-18    Report to [facility] Main  Entrance    Report to Admitting at 5:30 AM    Call this number if you have problems the morning of surgery [phone]    Remember: MORNING OF SURGERY DRINK:  1SHAKE BEFORE YOU LEAVE HOME, DRINK ALL OF THE SHAKE AT ONE TIME.   NO SOLID FOOD AFTER 600 PM THE NIGHT BEFORE YOUR SURGERY. YOU MAY DRINK CLEAR FLUIDS. THE SHAKE YOU DRINK BEFORE YOU LEAVE HOME WILL BE THE LAST FLUIDS YOU DRINK BEFORE SURGERY.  PAIN IS EXPECTED AFTER SURGERY AND WILL NOT BE COMPLETELY ELIMINATED. AMBULATION AND TYLENOL WILL HELP REDUCE INCISIONAL AND GAS PAIN. MOVEMENT IS KEY!  YOU ARE EXPECTED TO BE OUT OF BED WITHIN 4 HOURS OF ADMISSION TO YOUR PATIENT ROOM.  SITTING IN THE RECLINER THROUGHOUT THE DAY IS IMPORTANT FOR DRINKING FLUIDS AND MOVING GAS THROUGHOUT THE GI TRACT.  COMPRESSION STOCKINGS SHOULD BE WORN [facility] STAY UNLESS YOU ARE WALKING.   INCENTIVE SPIROMETER SHOULD BE USED EVERY HOUR WHILE AWAKE TO DECREASE POST-OPERATIVE COMPLICATIONS SUCH AS PNEUMONIA.  WHEN DISCHARGED HOME, IT IS IMPORTANT TO CONTINUE TO WALK EVERY HOUR AND USE THE INCENTIVE SPIROMETER EVERY HOUR.      CLEAR LIQUID DIET   Foods Allowed                                                                     Foods Excluded  Coffee and tea, regular and decaf                             liquids that you cannot  Plain Jell-O in any flavor                                             see through such as: Fruit ices (not with fruit pulp)                                     milk, soups, orange juice  Iced Popsicles                                    All solid food Carbonated beverages, regular and diet                                    Cranberry, grape and apple juices Sports drinks like Gatorade Lightly seasoned clear broth or consume(fat free) Sugar, honey syrup  Sample  Menu Breakfast                                Lunch  Supper Cranberry juice                    Beef broth                            Chicken broth Jell-O                                     Grape juice                           Apple juice Coffee or tea                        Jell-O                                      Popsicle                                                Coffee or tea                        Coffee or tea  _____________________________________________________________________          BRUSH YOUR TEETH MORNING OF SURGERY AND RINSE YOUR MOUTH OUT, NO CHEWING GUM CANDY OR MINTS.     Take these medicines the morning of surgery with A SIP OF WATER: None                                 You may not have any metal on your body including hair pins and              piercings  Do not wear jewelry, make-up, lotions, powders or perfumes, deodorant             Do not wear nail polish.  Do not shave  48 hours prior to surgery.               Do not bring valuables to the hospital. [place] IS NOT             RESPONSIBLE   FOR VALUABLES.  Contacts, dentures or bridgework may not be worn into surgery.  Leave suitcase in the car. After surgery it may be brought to your room.      Special Instructions: N/A              Please read over the following fact sheets you were given: _____________________________________________________________________             [facility] - Preparing for Surgery Before surgery, you can play an important role.  Because skin is not sterile, your skin needs to be as free of germs as possible.  You can reduce the number of germs on your skin by washing with CHG (chlorahexidine gluconate) soap before surgery.  CHG is an antiseptic cleaner which kills germs and bonds with the skin to continue killing germs even after washing. Please DO NOT use if you have an allergy to CHG or antibacterial  soaps.  If your skin  becomes reddened/irritated stop using the CHG and inform your nurse when you arrive at Short Stay. Do not shave (including legs and underarms) for at least 48 hours prior to the first CHG shower.  You may shave your face/neck. Please follow these instructions carefully:  1.  Shower with CHG Soap the night before surgery and the  morning of Surgery.  2.  If you choose to wash your hair, wash your hair first as usual with your  normal  shampoo.  3.  After you shampoo, rinse your hair and body thoroughly to remove the  shampoo.                           4.  Use CHG as you would any other liquid soap.  You can apply chg directly  to the skin and wash                       Gently with a scrungie or clean washcloth.  5.  Apply the CHG Soap to your body ONLY FROM THE NECK DOWN.   Do not use on face/ open                           Wound or open sores. Avoid contact with eyes, ears mouth and genitals (private parts).                       Wash face,  Genitals (private parts) with your normal soap.             6.  Wash thoroughly, paying special attention to the area where your surgery  will be performed.  7.  Thoroughly rinse your body with warm water from the neck down.  8.  DO NOT shower/wash with your normal soap after using and rinsing off  the CHG Soap.                9.  Pat yourself dry with a clean towel.            10.  Wear clean pajamas.            11.  Place clean sheets on your bed the night of your first shower and do not  sleep with pets. Day of Surgery : Do not apply any lotions/deodorants the morning of surgery.  Please wear clean clothes to the hospital/surgery center.  FAILURE TO FOLLOW THESE INSTRUCTIONS MAY RESULT IN THE CANCELLATION OF YOUR SURGERY PATIENT SIGNATURE_________________________________  NURSE SIGNATURE__________________________________  ________________________________________________________________________  WHAT IS A BLOOD TRANSFUSION? Blood Transfusion  Information  A transfusion is the replacement of blood or some of its parts. Blood is made up of multiple cells which provide different functions.  Red blood cells carry oxygen and are used for blood loss replacement.  White blood cells fight against infection.  Platelets control bleeding.  Plasma helps clot blood.  Other blood products are available for specialized needs, such as hemophilia or other clotting disorders. BEFORE THE TRANSFUSION  Who gives blood for transfusions?   Healthy volunteers who are fully evaluated to make sure their blood is safe. This is blood bank blood. Transfusion therapy is the safest it has ever been in the practice of medicine. Before blood is taken from a donor, a complete history is taken to make sure that person has no history of  diseases nor engages in risky social behavior (examples are intravenous drug use or sexual activity with multiple partners). The donor's travel history is screened to minimize risk of transmitting infections, such as malaria. The donated blood is tested for signs of infectious diseases, such as HIV and hepatitis. The blood is then tested to be sure it is compatible with you in order to minimize the chance of a transfusion reaction. If you or a relative donates blood, this is often done in anticipation of surgery and is not appropriate for emergency situations. It takes many days to process the donated blood. RISKS AND COMPLICATIONS Although transfusion therapy is very safe and saves many lives, the main dangers of transfusion include:   Getting an infectious disease.  Developing a transfusion reaction. This is an allergic reaction to something in the blood you were given. Every precaution is taken to prevent this. The decision to have a blood transfusion has been considered carefully by your caregiver before blood is given. Blood is not given unless the benefits outweigh the risks. AFTER THE TRANSFUSION  Right after receiving a  blood transfusion, you will usually feel much better and more energetic. This is especially true if your red blood cells have gotten low (anemic). The transfusion raises the level of the red blood cells which carry oxygen, and this usually causes an energy increase.  The nurse administering the transfusion will monitor you carefully for complications. HOME CARE INSTRUCTIONS  No special instructions are needed after a transfusion. You may find your energy is better. Speak with your caregiver about any limitations on activity for underlying diseases you may have. SEEK MEDICAL CARE IF:   Your condition is not improving after your transfusion.  You develop redness or irritation at the intravenous (IV) site. SEEK IMMEDIATE MEDICAL CARE IF:  Any of the following symptoms occur over the next 12 hours:  Shaking chills.  You have a temperature by mouth above 102 F (38.9 C), not controlled by medicine.  Chest, back, or muscle pain.  People around you feel you are not acting correctly or are confused.  Shortness of breath or difficulty breathing.  Dizziness and fainting.  You get a rash or develop hives.  You have a decrease in urine output.  Your urine turns a dark color or changes to pink, red, or brown. Any of the following symptoms occur over the next 10 days:  You have a temperature by mouth above 102 F (38.9 C), not controlled by medicine.  Shortness of breath.  Weakness after normal activity.  The white part of the eye turns yellow (jaundice).  You have a decrease in the amount of urine or are urinating less often.  Your urine turns a dark color or changes to pink, red, or brown. Document Released: 04/06/2000 Document Revised: 07/02/2011 Document Reviewed: 11/24/2007 [employer] Patient Information 2014 [place], [place].  _______________________________________________________________________

## 2018-01-18 NOTE — Anesthesia Preprocedure Evaluation (Addendum)
Anesthesia Evaluation  Patient identified by MRN, date of birth, ID band Patient awake    Reviewed: Allergy & Precautions, NPO status , Patient's Chart, lab work & pertinent test results  Airway Mallampati: III  TM Distance: >3 FB Neck ROM: Full    Dental  (+) Dental Advisory Given   Pulmonary neg pulmonary ROS,    breath sounds clear to auscultation       Cardiovascular negative cardio ROS   Rhythm:Regular Rate:Normal     Neuro/Psych negative neurological ROS     GI/Hepatic negative GI ROS, Neg liver ROS,   Endo/Other  Morbid obesity  Renal/GU negative Renal ROS     Musculoskeletal   Abdominal   Peds  Hematology  (+) anemia ,   Anesthesia Other Findings   Reproductive/Obstetrics                            Lab Results  Component Value Date   WBC 5.9 01/16/2018   HGB 11.2 (L) 01/16/2018   HCT 36.2 01/16/2018   MCV 86.6 01/16/2018   PLT 289 01/16/2018   Lab Results  Component Value Date   CREATININE 0.90 01/16/2018   BUN 18 01/16/2018   NA 142 01/16/2018   K 3.8 01/16/2018   CL 109 01/16/2018   CO2 27 01/16/2018    Anesthesia Physical Anesthesia Plan  ASA: III  Anesthesia Plan: General   Post-op Pain Management:    Induction: Intravenous  PONV Risk Score and Plan: 4 or greater and Scopolamine patch - Pre-op, Midazolam, Dexamethasone and Ondansetron  Airway Management Planned: Oral ETT  Additional Equipment:   Intra-op Plan:   Post-operative Plan: Extubation in OR  Informed Consent: I have reviewed the patients History and Physical, chart, labs and discussed the procedure including the risks, benefits and alternatives for the proposed anesthesia with the patient or authorized representative who has indicated his/her understanding and acceptance.   Dental advisory given  Plan Discussed with: CRNA  Anesthesia Plan Comments:        Anesthesia Quick  Evaluation

## 2018-01-19 MED ORDER — BUPIVACAINE LIPOSOME 1.3 % IJ SUSP
20.0000 mL | Freq: Once | INTRAMUSCULAR | Status: DC
  Filled 2018-01-19: qty 20

## 2018-01-20 ENCOUNTER — Inpatient Hospital Stay (HOSPITAL_COMMUNITY): Payer: Managed Care, Other (non HMO) | Admitting: Anesthesiology

## 2018-01-20 ENCOUNTER — Other Ambulatory Visit: Payer: Self-pay

## 2018-01-20 ENCOUNTER — Encounter (HOSPITAL_COMMUNITY)
Admission: RE | Disposition: A | Discharge status: Home / Self Care | Payer: Self-pay | Source: Ambulatory Visit | Attending: General Surgery

## 2018-01-20 ENCOUNTER — Encounter (HOSPITAL_COMMUNITY): Payer: Self-pay | Admitting: *Deleted

## 2018-01-20 ENCOUNTER — Inpatient Hospital Stay (HOSPITAL_COMMUNITY)
Admission: RE | Admit: 2018-01-20 | Discharge: 2018-01-21 | DRG: 621 | Disposition: A | Discharge status: Home / Self Care | Payer: Managed Care, Other (non HMO) | Source: Ambulatory Visit | Attending: General Surgery | Admitting: General Surgery

## 2018-01-20 DIAGNOSIS — D649 Anemia, unspecified: Secondary | ICD-10-CM | POA: Diagnosis present

## 2018-01-20 DIAGNOSIS — Z6841 Body Mass Index (BMI) 40.0 and over, adult: Secondary | ICD-10-CM | POA: Diagnosis not present

## 2018-01-20 HISTORY — PX: LAPAROSCOPIC GASTRIC SLEEVE RESECTION: SHX5895

## 2018-01-20 LAB — TYPE AND SCREEN
ABO/RH(D): A POS
ANTIBODY SCREEN: NEGATIVE

## 2018-01-20 LAB — PREGNANCY, URINE: Preg Test, Ur: NEGATIVE

## 2018-01-20 LAB — HEMOGLOBIN AND HEMATOCRIT, BLOOD
HCT: 36.2 % (ref 36.0–46.0)
Hemoglobin: 11.7 g/dL — ABNORMAL LOW (ref 12.0–15.0)

## 2018-01-20 SURGERY — GASTRECTOMY, SLEEVE, LAPAROSCOPIC
Anesthesia: General | Site: Abdomen

## 2018-01-20 MED ORDER — MORPHINE SULFATE (PF) 4 MG/ML IV SOLN
1.0000 mg | INTRAVENOUS | Status: DC | PRN
  Administered 2018-01-20: 2 mg via INTRAVENOUS
  Filled 2018-01-20: qty 1

## 2018-01-20 MED ORDER — SUGAMMADEX SODIUM 500 MG/5ML IV SOLN
INTRAVENOUS | Status: AC
  Filled 2018-01-20: qty 5

## 2018-01-20 MED ORDER — CHLORHEXIDINE GLUCONATE 4 % EX LIQD: 60.0000 mL | Freq: Once | CUTANEOUS | Status: DC

## 2018-01-20 MED ORDER — ALBUMIN HUMAN 5 % IV SOLN
INTRAVENOUS | Status: AC
  Filled 2018-01-20: qty 250

## 2018-01-20 MED ORDER — SIMETHICONE 80 MG PO CHEW: 80.0000 mg | CHEWABLE_TABLET | Freq: Four times a day (QID) | ORAL | Status: DC | PRN

## 2018-01-20 MED ORDER — ROCURONIUM BROMIDE 10 MG/ML (PF) SYRINGE
PREFILLED_SYRINGE | INTRAVENOUS | Status: AC
  Filled 2018-01-20: qty 10

## 2018-01-20 MED ORDER — GABAPENTIN 300 MG PO CAPS
300.0000 mg | ORAL_CAPSULE | ORAL | Status: AC
  Administered 2018-01-20: 300 mg via ORAL
  Filled 2018-01-20: qty 1

## 2018-01-20 MED ORDER — DEXAMETHASONE SODIUM PHOSPHATE 10 MG/ML IJ SOLN
INTRAMUSCULAR | Status: DC | PRN
  Administered 2018-01-20: 6 mg via INTRAVENOUS

## 2018-01-20 MED ORDER — APREPITANT 40 MG PO CAPS
40.0000 mg | ORAL_CAPSULE | ORAL | Status: AC
  Administered 2018-01-20: 40 mg via ORAL
  Filled 2018-01-20: qty 1

## 2018-01-20 MED ORDER — OXYCODONE HCL 5 MG/5ML PO SOLN
5.0000 mg | ORAL | Status: DC | PRN
  Administered 2018-01-20 – 2018-01-21 (×3): 5 mg via ORAL
  Filled 2018-01-20 (×3): qty 5

## 2018-01-20 MED ORDER — SUGAMMADEX SODIUM 200 MG/2ML IV SOLN
INTRAVENOUS | Status: DC | PRN
  Administered 2018-01-20: 300 mg via INTRAVENOUS

## 2018-01-20 MED ORDER — ROCURONIUM BROMIDE 50 MG/5ML IV SOSY
PREFILLED_SYRINGE | INTRAVENOUS | Status: DC | PRN
  Administered 2018-01-20: 60 mg via INTRAVENOUS

## 2018-01-20 MED ORDER — LIDOCAINE 2% (20 MG/ML) 5 ML SYRINGE
INTRAMUSCULAR | Status: DC | PRN
  Administered 2018-01-20: 1.5 mg/kg/h via INTRAVENOUS

## 2018-01-20 MED ORDER — MIDAZOLAM HCL 2 MG/2ML IJ SOLN
INTRAMUSCULAR | Status: AC
  Filled 2018-01-20: qty 2

## 2018-01-20 MED ORDER — KETAMINE HCL 10 MG/ML IJ SOLN
INTRAMUSCULAR | Status: AC
  Filled 2018-01-20: qty 1

## 2018-01-20 MED ORDER — LIDOCAINE 2% (20 MG/ML) 5 ML SYRINGE
INTRAMUSCULAR | Status: AC
  Filled 2018-01-20: qty 10

## 2018-01-20 MED ORDER — ENOXAPARIN SODIUM 30 MG/0.3ML ~~LOC~~ SOLN
30.0000 mg | Freq: Two times a day (BID) | SUBCUTANEOUS | Status: DC
  Administered 2018-01-20 – 2018-01-21 (×2): 30 mg via SUBCUTANEOUS
  Filled 2018-01-20 (×2): qty 0.3

## 2018-01-20 MED ORDER — HEPARIN SODIUM (PORCINE) 5000 UNIT/ML IJ SOLN
5000.0000 [IU] | INTRAMUSCULAR | Status: AC
  Administered 2018-01-20: 5000 [IU] via SUBCUTANEOUS
  Filled 2018-01-20: qty 1

## 2018-01-20 MED ORDER — ONDANSETRON HCL 4 MG/2ML IJ SOLN
INTRAMUSCULAR | Status: DC | PRN
  Administered 2018-01-20: 4 mg via INTRAVENOUS

## 2018-01-20 MED ORDER — DEXAMETHASONE SODIUM PHOSPHATE 4 MG/ML IJ SOLN: 4.0000 mg | INTRAMUSCULAR | Status: DC

## 2018-01-20 MED ORDER — SUCCINYLCHOLINE CHLORIDE 200 MG/10ML IV SOSY
PREFILLED_SYRINGE | INTRAVENOUS | Status: DC | PRN
  Administered 2018-01-20: 100 mg via INTRAVENOUS

## 2018-01-20 MED ORDER — EPHEDRINE SULFATE-NACL 50-0.9 MG/10ML-% IV SOSY
PREFILLED_SYRINGE | INTRAVENOUS | Status: DC | PRN
  Administered 2018-01-20: 15 mg via INTRAVENOUS

## 2018-01-20 MED ORDER — ACETAMINOPHEN 500 MG PO TABS
1000.0000 mg | ORAL_TABLET | ORAL | Status: AC
  Administered 2018-01-20: 1000 mg via ORAL
  Filled 2018-01-20: qty 2

## 2018-01-20 MED ORDER — LACTATED RINGERS IV SOLN
INTRAVENOUS | Status: DC
  Administered 2018-01-20: 06:00:00 via INTRAVENOUS

## 2018-01-20 MED ORDER — GLYCOPYRROLATE PF 0.2 MG/ML IJ SOSY
PREFILLED_SYRINGE | INTRAMUSCULAR | Status: AC
  Filled 2018-01-20: qty 1

## 2018-01-20 MED ORDER — BUPIVACAINE HCL 0.25 % IJ SOLN
INTRAMUSCULAR | Status: DC | PRN
  Administered 2018-01-20 (×2): 30 mL

## 2018-01-20 MED ORDER — ALBUMIN HUMAN 5 % IV SOLN
INTRAVENOUS | Status: DC | PRN
  Administered 2018-01-20: 08:00:00 via INTRAVENOUS

## 2018-01-20 MED ORDER — KETAMINE HCL 10 MG/ML IJ SOLN
INTRAMUSCULAR | Status: DC | PRN
  Administered 2018-01-20: 30 mg via INTRAVENOUS

## 2018-01-20 MED ORDER — BUPIVACAINE HCL (PF) 0.25 % IJ SOLN
INTRAMUSCULAR | Status: AC
  Filled 2018-01-20: qty 30

## 2018-01-20 MED ORDER — PHENYLEPHRINE 40 MCG/ML (10ML) SYRINGE FOR IV PUSH (FOR BLOOD PRESSURE SUPPORT)
PREFILLED_SYRINGE | INTRAVENOUS | Status: AC
  Filled 2018-01-20: qty 10

## 2018-01-20 MED ORDER — PROPOFOL 10 MG/ML IV BOLUS
INTRAVENOUS | Status: AC
  Filled 2018-01-20: qty 20

## 2018-01-20 MED ORDER — LIDOCAINE 2% (20 MG/ML) 5 ML SYRINGE
INTRAMUSCULAR | Status: AC
  Filled 2018-01-20: qty 5

## 2018-01-20 MED ORDER — LIDOCAINE HCL 2 % IJ SOLN
INTRAMUSCULAR | Status: AC
  Filled 2018-01-20: qty 20

## 2018-01-20 MED ORDER — FAMOTIDINE IN NACL 20-0.9 MG/50ML-% IV SOLN
20.0000 mg | Freq: Two times a day (BID) | INTRAVENOUS | Status: DC
  Administered 2018-01-20 – 2018-01-21 (×3): 20 mg via INTRAVENOUS
  Filled 2018-01-20 (×3): qty 50

## 2018-01-20 MED ORDER — LIDOCAINE 2% (20 MG/ML) 5 ML SYRINGE
INTRAMUSCULAR | Status: DC | PRN
  Administered 2018-01-20: 100 mg via INTRAVENOUS

## 2018-01-20 MED ORDER — BUPIVACAINE LIPOSOME 1.3 % IJ SUSP
INTRAMUSCULAR | Status: DC | PRN
  Administered 2018-01-20: 20 mL

## 2018-01-20 MED ORDER — ENSURE PRE-SURGERY PO LIQD
592.0000 mL | Freq: Once | ORAL | Status: DC
  Filled 2018-01-20: qty 592

## 2018-01-20 MED ORDER — SODIUM CHLORIDE 0.9 % IV SOLN
2.0000 g | INTRAVENOUS | Status: AC
  Administered 2018-01-20: 2 g via INTRAVENOUS
  Filled 2018-01-20: qty 2

## 2018-01-20 MED ORDER — GABAPENTIN 100 MG PO CAPS
200.0000 mg | ORAL_CAPSULE | Freq: Two times a day (BID) | ORAL | Status: DC
  Administered 2018-01-20 – 2018-01-21 (×2): 200 mg via ORAL
  Filled 2018-01-20 (×2): qty 2

## 2018-01-20 MED ORDER — ACETAMINOPHEN 160 MG/5ML PO SOLN
650.0000 mg | Freq: Four times a day (QID) | ORAL | Status: DC
  Administered 2018-01-20 – 2018-01-21 (×4): 650 mg via ORAL
  Filled 2018-01-20 (×4): qty 20.3

## 2018-01-20 MED ORDER — FENTANYL CITRATE (PF) 250 MCG/5ML IJ SOLN
INTRAMUSCULAR | Status: DC | PRN
  Administered 2018-01-20 (×2): 25 ug via INTRAVENOUS
  Administered 2018-01-20: 50 ug via INTRAVENOUS

## 2018-01-20 MED ORDER — FENTANYL CITRATE (PF) 250 MCG/5ML IJ SOLN
INTRAMUSCULAR | Status: AC
  Filled 2018-01-20: qty 5

## 2018-01-20 MED ORDER — MIDAZOLAM HCL 5 MG/5ML IJ SOLN
INTRAMUSCULAR | Status: DC | PRN
  Administered 2018-01-20: 2 mg via INTRAVENOUS

## 2018-01-20 MED ORDER — PHENYLEPHRINE 40 MCG/ML (10ML) SYRINGE FOR IV PUSH (FOR BLOOD PRESSURE SUPPORT)
PREFILLED_SYRINGE | INTRAVENOUS | Status: DC | PRN
  Administered 2018-01-20 (×3): 80 ug via INTRAVENOUS

## 2018-01-20 MED ORDER — 0.9 % SODIUM CHLORIDE (POUR BTL) OPTIME
TOPICAL | Status: DC | PRN
  Administered 2018-01-20: 1000 mL

## 2018-01-20 MED ORDER — PROPOFOL 10 MG/ML IV BOLUS
INTRAVENOUS | Status: DC | PRN
  Administered 2018-01-20: 200 mg via INTRAVENOUS

## 2018-01-20 MED ORDER — HYDRALAZINE HCL 20 MG/ML IJ SOLN: 10.0000 mg | INTRAMUSCULAR | Status: DC | PRN

## 2018-01-20 MED ORDER — STERILE WATER FOR IRRIGATION IR SOLN
Status: DC | PRN
  Administered 2018-01-20: 2000 mL

## 2018-01-20 MED ORDER — ONDANSETRON HCL 4 MG/2ML IJ SOLN
4.0000 mg | Freq: Once | INTRAMUSCULAR | Status: AC | PRN
  Administered 2018-01-20: 4 mg via INTRAVENOUS

## 2018-01-20 MED ORDER — SUCCINYLCHOLINE CHLORIDE 200 MG/10ML IV SOSY
PREFILLED_SYRINGE | INTRAVENOUS | Status: AC
  Filled 2018-01-20: qty 20

## 2018-01-20 MED ORDER — ONDANSETRON HCL 4 MG/2ML IJ SOLN
4.0000 mg | INTRAMUSCULAR | Status: DC | PRN
  Administered 2018-01-21: 4 mg via INTRAVENOUS
  Filled 2018-01-20: qty 2

## 2018-01-20 MED ORDER — DEXAMETHASONE SODIUM PHOSPHATE 10 MG/ML IJ SOLN
INTRAMUSCULAR | Status: AC
  Filled 2018-01-20: qty 1

## 2018-01-20 MED ORDER — EPHEDRINE 5 MG/ML INJ
INTRAVENOUS | Status: AC
  Filled 2018-01-20: qty 10

## 2018-01-20 MED ORDER — SUCCINYLCHOLINE CHLORIDE 200 MG/10ML IV SOSY
PREFILLED_SYRINGE | INTRAVENOUS | Status: AC
  Filled 2018-01-20: qty 10

## 2018-01-20 MED ORDER — ONDANSETRON HCL 4 MG/2ML IJ SOLN
INTRAMUSCULAR | Status: AC
  Filled 2018-01-20: qty 2

## 2018-01-20 MED ORDER — LACTATED RINGERS IR SOLN
Status: DC | PRN
  Administered 2018-01-20: 1000 mL

## 2018-01-20 MED ORDER — SODIUM CHLORIDE 0.9 % IV SOLN
INTRAVENOUS | Status: DC
  Administered 2018-01-20 – 2018-01-21 (×3): via INTRAVENOUS

## 2018-01-20 MED ORDER — FENTANYL CITRATE (PF) 100 MCG/2ML IJ SOLN
25.0000 ug | INTRAMUSCULAR | Status: DC | PRN
  Administered 2018-01-20 (×2): 25 ug via INTRAVENOUS
  Administered 2018-01-20: 50 ug via INTRAVENOUS

## 2018-01-20 MED ORDER — PREMIER PROTEIN SHAKE
2.0000 [oz_av] | ORAL | Status: DC
  Administered 2018-01-21 (×2): 2 [oz_av] via ORAL

## 2018-01-20 MED ORDER — ENSURE PRE-SURGERY PO LIQD
296.0000 mL | Freq: Once | ORAL | Status: DC
  Filled 2018-01-20: qty 296

## 2018-01-20 MED ORDER — SCOPOLAMINE 1 MG/3DAYS TD PT72
1.0000 | MEDICATED_PATCH | TRANSDERMAL | Status: DC
  Administered 2018-01-20: 1.5 mg via TRANSDERMAL
  Filled 2018-01-20: qty 1

## 2018-01-20 MED ORDER — FENTANYL CITRATE (PF) 100 MCG/2ML IJ SOLN
INTRAMUSCULAR | Status: AC
  Administered 2018-01-20: 25 ug via INTRAVENOUS
  Filled 2018-01-20: qty 2

## 2018-01-20 SURGICAL SUPPLY — 52 items
APPLIER CLIP ROT 13.4 12 LRG (CLIP) ×3
BAG LAPAROSCOPIC 12 15 PORT 16 (BASKET) ×1 IMPLANT
BAG RETRIEVAL 12/15 (BASKET) ×2
BAG RETRIEVAL 12/15MM (BASKET) ×1
BANDAGE ADH SHEER 1  50/CT (GAUZE/BANDAGES/DRESSINGS) ×3 IMPLANT
BENZOIN TINCTURE PRP APPL 2/3 (GAUZE/BANDAGES/DRESSINGS) ×3 IMPLANT
BLADE SURG SZ11 CARB STEEL (BLADE) ×3 IMPLANT
CABLE HIGH FREQUENCY MONO STRZ (ELECTRODE) IMPLANT
CHLORAPREP W/TINT 26ML (MISCELLANEOUS) ×6 IMPLANT
CLIP APPLIE ROT 13.4 12 LRG (CLIP) ×1 IMPLANT
CLOSURE STERI-STRIP 1/2X4 (GAUZE/BANDAGES/DRESSINGS) ×1
CLSR STERI-STRIP ANTIMIC 1/2X4 (GAUZE/BANDAGES/DRESSINGS) ×2 IMPLANT
COVER SURGICAL LIGHT HANDLE (MISCELLANEOUS) ×3 IMPLANT
DRAPE UNIVERSAL PACK (DRAPES) ×3 IMPLANT
DRAPE UTILITY XL STRL (DRAPES) ×6 IMPLANT
ELECT REM PT RETURN 15FT ADLT (MISCELLANEOUS) ×3 IMPLANT
GLOVE BIOGEL PI IND STRL 7.0 (GLOVE) ×1 IMPLANT
GLOVE BIOGEL PI INDICATOR 7.0 (GLOVE) ×2
GLOVE SURG SS PI 7.0 STRL IVOR (GLOVE) ×3 IMPLANT
GOWN STRL REUS W/TWL LRG LVL3 (GOWN DISPOSABLE) ×3 IMPLANT
GOWN STRL REUS W/TWL XL LVL3 (GOWN DISPOSABLE) ×9 IMPLANT
GRASPER SUT TROCAR 14GX15 (MISCELLANEOUS) ×3 IMPLANT
HOVERMATT SINGLE USE (MISCELLANEOUS) ×3 IMPLANT
KIT BASIN OR (CUSTOM PROCEDURE TRAY) ×3 IMPLANT
MARKER SKIN DUAL TIP RULER LAB (MISCELLANEOUS) ×3 IMPLANT
NEEDLE SPNL 22GX3.5 QUINCKE BK (NEEDLE) ×3 IMPLANT
RELOAD STAPLER BLUE 60MM (STAPLE) ×3 IMPLANT
RELOAD STAPLER GOLD 60MM (STAPLE) ×1 IMPLANT
RELOAD STAPLER GREEN 60MM (STAPLE) ×1 IMPLANT
SCISSORS LAP 5X45 EPIX DISP (ENDOMECHANICALS) IMPLANT
SET IRRIG TUBING LAPAROSCOPIC (IRRIGATION / IRRIGATOR) ×3 IMPLANT
SHEARS HARMONIC ACE PLUS 45CM (MISCELLANEOUS) ×3 IMPLANT
SLEEVE GASTRECTOMY 40FR VISIGI (MISCELLANEOUS) ×3 IMPLANT
SLEEVE XCEL OPT CAN 5 100 (ENDOMECHANICALS) ×6 IMPLANT
SOLUTION ANTI FOG 6CC (MISCELLANEOUS) ×3 IMPLANT
SPONGE LAP 18X18 RF (DISPOSABLE) ×3 IMPLANT
STAPLER ECHELON LONG 60 440 (INSTRUMENTS) IMPLANT
STAPLER RELOAD BLUE 60MM (STAPLE) ×9
STAPLER RELOAD GOLD 60MM (STAPLE) ×3
STAPLER RELOAD GREEN 60MM (STAPLE) ×3
SUT ETHIBOND 0 36 GRN (SUTURE) IMPLANT
SUT MNCRL AB 4-0 PS2 18 (SUTURE) ×3 IMPLANT
SUT VICRYL 0 TIES 12 18 (SUTURE) ×3 IMPLANT
SYR 20CC LL (SYRINGE) ×3 IMPLANT
SYR 50ML LL SCALE MARK (SYRINGE) ×3 IMPLANT
TOWEL OR 17X26 10 PK STRL BLUE (TOWEL DISPOSABLE) ×3 IMPLANT
TOWEL OR NON WOVEN STRL DISP B (DISPOSABLE) ×3 IMPLANT
TROCAR BLADELESS 15MM (ENDOMECHANICALS) ×3 IMPLANT
TROCAR BLADELESS OPT 5 100 (ENDOMECHANICALS) ×3 IMPLANT
TUBING CONNECTING 10 (TUBING) ×2 IMPLANT
TUBING CONNECTING 10' (TUBING) ×1
TUBING INSUF HEATED (TUBING) ×3 IMPLANT

## 2018-01-20 NOTE — Progress Notes (Signed)
Discussed post op day goals with patient including ambulation, IS, diet progression, pain, and nausea control.  Questions answered. 

## 2018-01-20 NOTE — Anesthesia Procedure Notes (Signed)
Procedure Name: Intubation Date/Time: 01/20/2018 7:41 AM Performed by: Maxwell Caul, CRNA Pre-anesthesia Checklist: Patient identified, Emergency Drugs available, Suction available and Patient being monitored Patient Re-evaluated:Patient Re-evaluated prior to induction Oxygen Delivery Method: Circle system utilized Preoxygenation: Pre-oxygenation with 100% oxygen Induction Type: IV induction Ventilation: Mask ventilation without difficulty Laryngoscope Size: Mac and 4 Grade View: Grade I Tube type: Oral Tube size: 7.5 mm Number of attempts: 1 Airway Equipment and Method: Stylet Placement Confirmation: ETT inserted through vocal cords under direct vision,  positive ETCO2 and breath sounds checked- equal and bilateral Secured at: 21 cm Tube secured with: Tape Dental Injury: Teeth and Oropharynx as per pre-operative assessment

## 2018-01-20 NOTE — Op Note (Signed)
Name:  Naiomi Musto MRN: [id number] Date of Surgery: 01/20/2018  Preop Diagnosis:  Morbid Obesity  Postop Diagnosis:  Morbid Obesity (Weight - 120 kg, BMI - 41.5), S/P Gastric Sleeve resection  Procedure:  Upper endoscopy  (Intraoperative)  Surgeon:  Ovidio Kin, M.D.  Anesthesia:  GET  Indications for procedure: Kimberly Dennis is a 32 y.o. female whose primary care physician is Hoyt Koch, MD (Inactive) and has completed a gastric sleeve resection today for weight loss by Dr. Sheliah Hatch.  I am doing an intraoperative upper endoscopy to evaluate the gastric pouch after the sleeve gastrectomy.  Operative Note: The patient is under general anesthesia.  Dr. Sheliah Hatch is laparoscoping the patient while I do an upper endoscopy to evaluate the stomach pouch.  With the patient intubated, I passed the Olympus upper endoscope without difficulty down the esophagus.  The esophagus was unremarkable.  The esophago-gastric junction was at 39 cm.    The mucosa of the stomach looked viable and the staple line was intact without bleeding.  I advanced the scope to the pylorus, but did not go through it.  While I insufflated the stomach pouch with air, Dr. Sheliah Hatch  flooded the upper abdomen with saline to put the gastric pouch under saline.  There was no bubbling or evidence of a leak.  There was no evidence of narrowing of the pouch and the gastric sleeve looked tubular.  The scope was then withdrawn.  The esophagus was unremarkable and the patient tolerated the endoscopy without difficulty.  Ovidio Kin, MD, [facility] Surgery Pager: [phone] Office phone:  [phone]

## 2018-01-20 NOTE — Progress Notes (Signed)
Pt started drinking first 2oz cup of water at 1258.

## 2018-01-20 NOTE — Anesthesia Postprocedure Evaluation (Signed)
Anesthesia Post Note  Patient: Chief of Staff  Procedure(s) Performed: LAPAROSCOPIC GASTRIC SLEEVE RESECTION WITH UPPER ENDO AND ERAS PATHWAY (N/A Abdomen)     Patient location during evaluation: PACU Anesthesia Type: General Level of consciousness: awake and alert Pain management: pain level controlled Vital Signs Assessment: post-procedure vital signs reviewed and stable Respiratory status: spontaneous breathing, nonlabored ventilation, respiratory function stable and patient connected to nasal cannula oxygen Cardiovascular status: blood pressure returned to baseline and stable Postop Assessment: no apparent nausea or vomiting Anesthetic complications: no    Last Vitals:  Vitals:   01/20/18 1200 01/20/18 1303  BP: 120/63 123/63  Pulse: 72 67  Resp: 18 18  Temp: 37.1 C 36.7 C  SpO2: 100% 100%    Last Pain:  Vitals:   01/20/18 1332  TempSrc:   PainSc: Ardean Larsen

## 2018-01-20 NOTE — Discharge Instructions (Signed)
° ° ° °GASTRIC BYPASS/SLEEVE ° Home Care Instructions ° ° These instructions are to help you care for yourself when you go home. ° °Call: If you have any problems. °• Call [phone] and ask for the surgeon on call °• If you need immediate help, come to the ER at [place].  °• Tell the ER staff that you are a new post-op gastric bypass or gastric sleeve patient °  °Signs and symptoms to report: • Severe vomiting or nausea °o If you cannot keep down clear liquids for longer than 1 day, call your surgeon  °• Abdominal pain that does not get better after taking your pain medication °• Fever over 100.4° F with chills °• Heart beating over 100 beats a minute °• Shortness of breath at rest °• Chest pain °•  Redness, swelling, drainage, or foul odor at incision (surgical) sites °•  If your incisions open or pull apart °• Swelling or pain in calf (lower leg) °• Diarrhea (Loose bowel movements that happen often), frequent watery, uncontrolled bowel movements °• Constipation, (no bowel movements for 3 days) if this happens: Pick one °o Milk of Magnesia, 2 tablespoons by mouth, 3 times a day for 2 days if needed °o Stop taking Milk of Magnesia once you have a bowel movement °o Call your doctor if constipation continues °Or °o Miralax  (instead of Milk of Magnesia) following the label instructions °o Stop taking Miralax once you have a bowel movement °o Call your doctor if constipation continues °• Anything you think is not normal °  °Normal side effects after surgery: • Unable to sleep at night or unable to focus °• Irritability or moody °• Being tearful (crying) or depressed °These are common complaints, possibly related to your anesthesia medications that put you to sleep, stress of surgery, and change in lifestyle.  This usually goes away a few weeks after surgery.  If these feelings continue, call your primary care doctor. °  °Wound Care: You may have surgical glue, steri-strips, or staples over your incisions after  surgery °• Surgical glue:  Looks like a clear film over your incisions and will wear off a little at a time °• Steri-strips: Strips of tape over your incisions. You may notice a yellowish color on the skin under the steri-strips. This is used to make the   steri-strips stick better. Do not pull the steri-strips off - let them fall off °• Staples: Staples may be removed before you leave the hospital °o If you go home with staples, call [facility], [phone] at for an appointment with your surgeon’s nurse to have staples removed 10 days after surgery. °• Showering: You may shower two (2) days after your surgery unless your surgeon tells you differently °o Wash gently around incisions with warm soapy water, rinse well, and gently pat dry  °o No tub baths until staples are removed, steri-strips fall off or glue is gone.  °  °Medications: • Medications should be liquid or crushed if larger than the size of a dime °• Extended release pills (medication that release a little bit at a time through the day) should NOT be crushed or cut. (examples include XL, ER, DR, SR) °• Depending on the size and number of medications you take, you may need to space (take a few throughout the day)/change the time you take your medications so that you do not over-fill your pouch (smaller stomach) °• Make sure you follow-up with your primary care doctor to   make medication changes needed during rapid weight loss and life-style changes °• If you have diabetes, follow up with the doctor that orders your diabetes medication(s) within one week after surgery and check your blood sugar regularly. °• Do not drive while taking prescription pain medication  °• It is ok to take Tylenol by the bottle instructions with your pain medicine or instead of your pain medicine as needed.  DO NOT TAKE NSAIDS (EXAMPLES OF NSAIDS:  IBUPROFREN/ NAPROXEN)  °Diet:                    First 2 Weeks ° You will see the dietician t about two (2) weeks  after your surgery. The dietician will increase the types of foods you can eat if you are handling liquids well: °• If you have severe vomiting or nausea and cannot keep down clear liquids lasting longer than 1 day, call your surgeon @ ([phone]) °Protein Shake °• Drink at least 2 ounces of shake 5-6 times per day °• Each serving of protein shakes (usually 8 - 12 ounces) should have: °o 15 grams of protein  °o And no more than 5 grams of carbohydrate  °• Goal for protein each day: °o Men = 80 grams per day °o Women = 60 grams per day °• Protein powder may be added to fluids such as non-fat milk or Lactaid milk or unsweetened Soy/Almond milk (limit to 35 grams added protein powder per serving) ° °Hydration °• Slowly increase the amount of water and other clear liquids as tolerated (See Acceptable Fluids) °• Slowly increase the amount of protein shake as tolerated  °•  Sip fluids slowly and throughout the day.  Do not use straws. °• May use sugar substitutes in small amounts (no more than 6 - 8 packets per day; i.e. Splenda) ° °Fluid Goal °• The first goal is to drink at least 8 ounces of protein shake/drink per day (or as directed by the nutritionist); some examples of protein shakes are Syntrax Nectar, Adkins Advantage, EAS Edge HP, and Unjury. See handout from pre-op Bariatric Education Class: °o Slowly increase the amount of protein shake you drink as tolerated °o You may find it easier to slowly sip shakes throughout the day °o It is important to get your proteins in first °• Your fluid goal is to drink 64 - 100 ounces of fluid daily °o It may take a few weeks to build up to this °• 32 oz (or more) should be clear liquids  °And  °• 32 oz (or more) should be full liquids (see below for examples) °• Liquids should not contain sugar, caffeine, or carbonation ° °Clear Liquids: °• Water or Sugar-free flavored water (i.e. Fruit H2O, Propel) °• Decaffeinated coffee or tea (sugar-free) °• Crystal Lite, Wyler’s Lite,  Minute Maid Lite °• Sugar-free Jell-O °• Bouillon or broth °• Sugar-free Popsicle:   *Less than 20 calories each; Limit 1 per day ° °Full Liquids: °Protein Shakes/Drinks + 2 choices per day of other full liquids °• Full liquids must be: °o No More Than 15 grams of Carbs per serving  °o No More Than 3 grams of Fat per serving °• Strained low-fat cream soup (except Cream of Potato or Tomato) °• Non-Fat milk °• Fat-free Lactaid Milk °• Unsweetened Soy Or Unsweetened Almond Milk °• Low Sugar yogurt (Dannon Lite & Fit, Greek yogurt; Oikos Triple Zero; Chobani Simply 100; Yoplait 100 calorie Greek - No Fruit on the Bottom) ° °  °Vitamins   and Minerals • Start 1 day after surgery unless otherwise directed by your surgeon °• 2 Chewable Bariatric Specific Multivitamin / Multimineral Supplement with iron (Example: Bariatric Advantage Multi EA) °• Chewable Calcium with Vitamin D-3 °(Example: 3 Chewable Calcium Plus 600 with Vitamin D-3) °o Take 500 mg three (3) times a day for a total of 1500 mg each day °o Do not take all 3 doses of calcium at one time as it may cause constipation, and you can only absorb 500 mg  at a time  °o Do not mix multivitamins containing iron with calcium supplements; take 2 hours apart °• Menstruating women and those with a history of anemia (a blood disease that causes weakness) may need extra iron °o Talk with your doctor to see if you need more iron °• Do not stop taking or change any vitamins or minerals until you talk to your dietitian or surgeon °• Your Dietitian and/or surgeon must approve all vitamin and mineral supplements °  °Activity and Exercise: Limit your physical activity as instructed by your doctor.  It is important to continue walking at home.  During this time, use these guidelines: °• Do not lift anything greater than ten (10) pounds for at least two (2) weeks °• Do not go back to work or drive until your surgeon says you can °• You may have sex when you feel comfortable  °o It is  VERY important for female patients to use a reliable birth control method; fertility often increases after surgery  °o All hormonal birth control will be ineffective for 30 days after surgery due to medications given during surgery a barrier method must be used. °o Do not get pregnant for at least 18 months °• Start exercising as soon as your doctor tells you that you can °o Make sure your doctor approves any physical activity °• Start with a simple walking program °• Walk 5-15 minutes each day, 7 days per week.  °• Slowly increase until you are walking 30-45 minutes per day °Consider joining our BELT program. [phone] or email [email or web address] °  °Special Instructions Things to remember: °• Use your CPAP when sleeping if this applies to you ° °• [facility] has two free Bariatric Surgery Support Groups that meet monthly °o The 3rd Thursday of each month, 6 pm,  Education [facility] Classrooms  °o The 2nd Friday of each month, 11:45 am in the private dining room in the basement of  °• It is very important to keep all follow up appointments with your surgeon, dietitian, primary care physician, and behavioral health practitioner °• Routine follow up schedule with your surgeon include appointments at 2-3 weeks, 6-8 weeks, 6 months, and 1 year at a minimum.  Your surgeon may request to see you more often.   °o After the first year, please follow up with your bariatric surgeon and dietitian at least once a year in order to maintain best weight loss results °[facility]: [phone] °[facility]: [phone] °Bariatric Nurse Coordinator: [phone] °  °   Reviewed and Endorsed  °by [facility] Patient Education Committee, June, 2016 °Edits Approved: Aug, 2018 ° ° ° °

## 2018-01-20 NOTE — Op Note (Signed)
Preop Diagnosis: Obesity Class III  Postop Diagnosis: same  Procedure performed: laparoscopic Sleeve Gastrectomy  Assitant: Ovidio Kin  Indications:  The patient is a 32 y.o. year-old morbidly obese female who has been followed in the Bariatric Clinic as an outpatient. This patient was diagnosed with morbid obesity with a BMI of Body mass index is 41.5 kg/m.  The patient was counseled extensively in the Bariatric Outpatient Clinic and after a thorough explanation of the risks and benefits of surgery (including death from complications, bowel leak, infection such as peritonitis and/or sepsis, internal hernia, bleeding, need for blood transfusion, bowel obstruction, organ failure, pulmonary embolus, deep venous thrombosis, wound infection, incisional hernia, skin breakdown, and others entailed on the consent form) and after a compliant diet and exercise program, the patient was scheduled for an elective laparoscopic sleeve gastrectomy.  Description of Operation:  Following informed consent, the patient was taken to the operating room and placed on the operating table in the supine position.  She had previously received prophylactic antibiotics and subcutaneous heparin for DVT prophylaxis in the pre-op holding area.  After induction of general endotracheal anesthesia by the anesthesiologist, the patient underwent placement of sequential compression devices and an oro-gastric tube.  A timeout was confirmed by the surgery and anesthesia teams.  The patient was adequately padded at all pressure points and placed on a footboard to prevent slippage from the OR table during extremes of position during surgery.  She underwent a routine sterile prep and drape of her entire abdomen.    Next, A transverse incision was made under the left subcostal area and a 5mm optical viewing trocar was introduced into the peritoneal cavity. Pneumoperitoneum was applied with a high flow and low pressure. A laparoscope was  inserted to confirm placement. A extraperitoneal block was then placed at the lateral abdominal wall using exparel diluted with marcaine. 5 additional incisions were placed: 1 5mm trocar to the left of the midline. 1 additional 5mm trocar in the left lateral area, 1 12mm trocar in the right mid abdomen, 1 5mm trocar in the right subcostal area, and a Nathanson retractor was placed through a subxiphoid incision.  The fat pad at the GE junction was incised and the gastrodiaphragmatic ligament was divided using the Harmonic scalpel. Next, a hole was created through the lesser omentum along the greater curve of the stomach to enter the lesser sac. The vessels along the greater omentum were  Then ligated and divided using the Harmonic scalpel moving towards the spleen and then short gastric vessels were ligated and divided in the same fashion to fully mobilize the fundus. The left crus was identified to ensure completion of the dissection. Next the antrum was measured and dissection continued inferiorly along the greater curve towards the pylorus and stopped 6cm from the pylorus.   A 40Fr ViSiGi dilator was placed into the esophgaus and along the lesser curve of the stomach and placed on suction. 1 non-reinforced 60mm Green load echelon stapler(s) followed by 1 60mm Gold load echelon stapler(s) followed by 3 60mm blue load echelon stapler(s) were used to make the resection along the antrum being sure to stay well away from the angularis by angling the jaws of the stapler towards the greater curve and later completing the resection staying along the ViSiGi and ensuring the fundus was not retained by appropriately retracting it lateral. Air was inserted through the ViSiGi to perform a leak test showing no bubbles and a neutral lie of the stomach.  The  assistant then went and performed an upper endoscopy and leak test. There was a slight scallop at the angularis but the lumen was widely patent at that point. No  bubbles were seen and the sleeve and antrum distended appropriately. The specimen was then placed in an endocatch bag and removed by the 15mm port. The fascia of the 15mm port was closed with a 0 vicryl by suture passer. Hemostasis was ensured. Pneumoperitoneum was evacuated, all ports were removed and all incisions closed with 4-0 monocryl suture in subcuticular fashion. Steristrips and bandaids were put in place for dressing. The patient awoke from anesthesia and was brought to pacu in stable condition. All counts were correct.  Estimated blood loss: <40ml  Specimens:  Sleeve gastrectomy  Local Anesthesia: 50 ml Exparel:0.5% Marcaine mix  Post-Op Plan:       Pain Management: PO, prn      Antibiotics: Prophylactic      Anticoagulation: Prophylactic, Starting now      Post Op Studies/Consults: Not applicable      Intended Discharge: within 48h      Intended Outpatient Follow-Up: Two Week      Intended Outpatient Studies: Not Applicable      Other: Not Applicable  Images:      De Blanch Carrera Kiesel

## 2018-01-20 NOTE — Progress Notes (Addendum)
PHARMACY CONSULT FOR:  Risk Assessment for Post-Discharge VTE Following Bariatric Surgery  Post-Discharge VTE Risk Assessment: This patient's probability of 30-day post-discharge VTE is increased due to the factors marked:   Female    Age >/=60 years    BMI >/=50 kg/m2    CHF    Dyspnea at Rest    Paraplegia  X  Non-gastric-band surgery    Operation Time >/=3 hr    Return to OR     Length of Stay >/= 3 d   Predicted probability of 30-day post-discharge VTE: 0.16%  Other patient-specific factors to consider: n/a   Recommendation for Discharge: No pharmacologic prophylaxis post-discharge   Kimberly Dennis is a 32 y.o. female who underwent  laparoscopic Sleeve Gastrectomy on 01/20/18.   Case start: 0756 Case end: 0850   No Known Allergies  Patient Measurements: Height: 5\' 7"  (170.2 cm) Weight: 265 lb (120.2 kg) IBW/kg (Calculated) : 61.6 Body mass index is 41.5 kg/m.  Recent Labs    01/20/18 1043  HGB 11.7*  HCT 36.2   Estimated Creatinine Clearance: 120.4 mL/min (by C-G formula based on SCr of 0.9 mg/dL).    Past Medical History:  Diagnosis Date  . Medical history non-contributory      Medications Prior to Admission  Medication Sig Dispense Refill Last Dose  . Multiple Vitamin (MULTIVITAMIN WITH MINERALS) TABS tablet Take 1 tablet by mouth daily.   Past Week at Unknown time  . naproxen sodium (ALEVE) 220 MG tablet Take 220 mg by mouth daily as needed (pain).   Past Month at Unknown time       Kimberly Dennis 01/20/2018,11:39 AM

## 2018-01-20 NOTE — Transfer of Care (Signed)
Immediate Anesthesia Transfer of Care Note  Patient: Chief of Staff  Procedure(s) Performed: LAPAROSCOPIC GASTRIC SLEEVE RESECTION WITH UPPER ENDO AND ERAS PATHWAY (N/A Abdomen)  Patient Location: PACU  Anesthesia Type:General  Level of Consciousness: awake, alert  and oriented  Airway & Oxygen Therapy: Patient Spontanous Breathing and Patient connected to face mask oxygen  Post-op Assessment: Report given to RN and Post -op Vital signs reviewed and stable  Post vital signs: Reviewed and stable  Last Vitals:  Vitals Value Taken Time  BP    Temp    Pulse    Resp    SpO2      Last Pain:  Vitals:   01/20/18 0608  TempSrc:   PainSc: 0-No pain         Complications: No apparent anesthesia complications

## 2018-01-20 NOTE — H&P (Signed)
Kimberly Dennis is an 32 y.o. female.   Chief Complaint: obesity HPI: 32 yo female with long history of obesity. She has completed all requirements and is ready to proceed with bariatric surgery  Past Medical History:  Diagnosis Date  . Medical history non-contributory     Past Surgical History:  Procedure Laterality Date  . CESAREAN SECTION      History reviewed. No pertinent family history. Social History:  reports that she has never smoked. She has never used smokeless tobacco. She reports that she drinks alcohol. She reports that she does not use drugs.  Allergies: No Known Allergies  Medications Prior to Admission  Medication Sig Dispense Refill  . Multiple Vitamin (MULTIVITAMIN WITH MINERALS) TABS tablet Take 1 tablet by mouth daily.    . naproxen sodium (ALEVE) 220 MG tablet Take 220 mg by mouth daily as needed (pain).      Results for orders placed or performed during the hospital encounter of 01/20/18 (from the past 48 hour(s))  Pregnancy, urine STAT morning of surgery     Status: None   Collection Time: 01/20/18  5:38 AM  Result Value Ref Range   Preg Test, Ur NEGATIVE NEGATIVE    Comment:        THE SENSITIVITY OF THIS METHODOLOGY IS >20 mIU/mL. Performed at [facility], [address], [place], [place] [address]    No results found.  Review of Systems  Constitutional: Negative for chills and fever.  HENT: Negative for hearing loss.   Eyes: Negative for blurred vision and double vision.  Respiratory: Negative for cough and hemoptysis.   Cardiovascular: Negative for chest pain and palpitations.  Gastrointestinal: Negative for abdominal pain, nausea and vomiting.  Genitourinary: Negative for dysuria and urgency.  Musculoskeletal: Negative for myalgias and neck pain.  Skin: Negative for itching and rash.  Neurological: Negative for dizziness, tingling and headaches.  Endo/Heme/Allergies: Does not bruise/bleed easily.   Psychiatric/Behavioral: Negative for depression and suicidal ideas.    Blood pressure (!) 125/54, pulse 73, temperature 98.5 F (36.9 C), temperature source Oral, resp. rate 18, height 5\' 7"  (1.702 m), weight 120.2 kg, SpO2 100 %. Physical Exam  Vitals reviewed. Constitutional: She is oriented to person, place, and time. She appears well-developed and well-nourished.  HENT:  Head: Normocephalic and atraumatic.  Eyes: Pupils are equal, round, and reactive to light. Conjunctivae and EOM are normal.  Neck: Normal range of motion. Neck supple.  Cardiovascular: Normal rate and regular rhythm.  Respiratory: Effort normal and breath sounds normal.  GI: Soft. Bowel sounds are normal. She exhibits no distension. There is no tenderness.  Musculoskeletal: Normal range of motion.  Neurological: She is alert and oriented to person, place, and time.  Skin: Skin is warm and dry.  Psychiatric: She has a normal mood and affect. Her behavior is normal.     Assessment/Plan 32 yo female with severe obesity -lap sleeve gastrectomy -enhanced recovery -bariatric protocol  Rodman Pickle, MD 01/20/2018, 6:57 AM

## 2018-01-21 ENCOUNTER — Encounter (HOSPITAL_COMMUNITY): Payer: Self-pay | Admitting: General Surgery

## 2018-01-21 LAB — CBC WITH DIFFERENTIAL/PLATELET
BASOS ABS: 0 10*3/uL (ref 0.0–0.1)
BASOS PCT: 0 %
EOS ABS: 0 10*3/uL (ref 0.0–0.7)
EOS PCT: 0 %
HCT: 33.8 % — ABNORMAL LOW (ref 36.0–46.0)
HEMOGLOBIN: 10.6 g/dL — AB (ref 12.0–15.0)
LYMPHS ABS: 1.9 10*3/uL (ref 0.7–4.0)
Lymphocytes Relative: 25 %
MCH: 26.8 pg (ref 26.0–34.0)
MCHC: 31.4 g/dL (ref 30.0–36.0)
MCV: 85.6 fL (ref 78.0–100.0)
Monocytes Absolute: 0.8 10*3/uL (ref 0.1–1.0)
Monocytes Relative: 10 %
NEUTROS PCT: 65 %
Neutro Abs: 5 10*3/uL (ref 1.7–7.7)
PLATELETS: 271 10*3/uL (ref 150–400)
RBC: 3.95 MIL/uL (ref 3.87–5.11)
RDW: 15.3 % (ref 11.5–15.5)
WBC: 7.7 10*3/uL (ref 4.0–10.5)

## 2018-01-21 LAB — COMPREHENSIVE METABOLIC PANEL
ALBUMIN: 3.4 g/dL — AB (ref 3.5–5.0)
ALK PHOS: 42 U/L (ref 38–126)
ALT: 19 U/L (ref 0–44)
AST: 21 U/L (ref 15–41)
Anion gap: 6 (ref 5–15)
BUN: 7 mg/dL (ref 6–20)
CALCIUM: 8.3 mg/dL — AB (ref 8.9–10.3)
CHLORIDE: 108 mmol/L (ref 98–111)
CO2: 24 mmol/L (ref 22–32)
CREATININE: 0.66 mg/dL (ref 0.44–1.00)
GFR calc non Af Amer: >60 mL/min (ref >60)
GLUCOSE: 119 mg/dL — AB (ref 70–99)
Potassium: 3.6 mmol/L (ref 3.5–5.1)
Sodium: 138 mmol/L (ref 135–145)
Total Bilirubin: 0.9 mg/dL (ref 0.3–1.2)
Total Protein: 6.2 g/dL — ABNORMAL LOW (ref 6.5–8.1)

## 2018-01-21 MED ORDER — TRAMADOL HCL 50 MG PO TABS: 50.0000 mg | ORAL_TABLET | Freq: Four times a day (QID) | ORAL | 0 refills | Status: DC | PRN

## 2018-01-21 MED ORDER — PANTOPRAZOLE SODIUM 40 MG PO TBEC: 40.0000 mg | DELAYED_RELEASE_TABLET | Freq: Every day | ORAL | 0 refills | Status: DC

## 2018-01-21 MED ORDER — GABAPENTIN 300 MG PO CAPS: 300.0000 mg | ORAL_CAPSULE | Freq: Three times a day (TID) | ORAL | 0 refills | Status: DC

## 2018-01-21 MED ORDER — ONDANSETRON 4 MG PO TBDP: 4.0000 mg | ORAL_TABLET | Freq: Four times a day (QID) | ORAL | 0 refills | Status: DC | PRN

## 2018-01-21 NOTE — Progress Notes (Signed)
Discharge and medication instructions reviewed with patient. Questions answered and patient denies further questions. No prescriptions given to patient. Visitor in room is driving patient home. Lina Sar, RN

## 2018-01-21 NOTE — Progress Notes (Addendum)

## 2018-01-21 NOTE — Progress Notes (Signed)
Patient alert and oriented, Post op day 1.  Provided support and encouragement.  Encouraged pulmonary toilet, ambulation and small sips of liquids.  Completed 12 ounces of bari clear fluid and 2 ounces of protein shake.  All questions answered.  Will continue to monitor. 

## 2018-01-21 NOTE — Discharge Summary (Signed)
Physician Discharge Summary  Kimberly Dennis ZOX:[id number] DOB: [birth year] DOA: 01/20/2018  PCP: Hoyt Koch, MD (Inactive)  Admit date: 01/20/2018 Discharge date: 01/21/2018  Recommendations for Outpatient Follow-up:  1. (include homehealth, outpatient follow-up instructions, specific recommendations for PCP to follow-up on, etc.)  Follow-up Information    Lionardo Haze, De Blanch, MD. Go on 02/13/2018.   Specialty:  General Surgery Why:  at [address] Contact information: [address] [place] [address] [phone]        Burlene Montecalvo, De Blanch, MD .   Specialty:  General Surgery Contact information: [address] [place] [address] [place] [address] [phone]          Discharge Diagnoses:  Active Problems:   Morbid obesity (HCC)   Surgical Procedure: Laparoscopic Sleeve Gastrectomy, upper endoscopy  Discharge Condition: Good Disposition: Home  Diet recommendation: Postoperative sleeve gastrectomy diet (liquids only)  Filed Weights   01/20/18 0547 01/20/18 0608  Weight: 120.3 kg 120.2 kg     Hospital Course:  The patient was admitted after undergoing laparoscopic sleeve gastrectomy. POD 0 she ambulated well. POD 1 she was started on the water diet protocol and tolerated 460 ml in the first shift. Once meeting the water amount she was advanced to bariatric protein shakes which they tolerated and were discharged home POD 1.  Treatments: surgery: laparoscopic sleeve gastrectomy  Discharge Instructions  Discharge Instructions    Ambulate hourly while awake   Complete by:  As directed    Call MD for:  difficulty breathing, headache or visual disturbances   Complete by:  As directed    Call MD for:  persistant dizziness or light-headedness   Complete by:  As directed    Call MD for:  persistant nausea and vomiting   Complete by:  As directed    Call MD for:  redness, tenderness, or signs of infection (pain, swelling, redness, odor or green/yellow  discharge around incision site)   Complete by:  As directed    Call MD for:  severe uncontrolled pain   Complete by:  As directed    Call MD for:  temperature >101 F   Complete by:  As directed    Diet bariatric full liquid   Complete by:  As directed    Discharge wound care:   Complete by:  As directed    Remove Bandaids tomorrow, ok to shower tomorrow. Steristrips may fall off in 1-3 weeks.   Incentive spirometry   Complete by:  As directed    Perform hourly while awake     Allergies as of 01/21/2018   No Known Allergies     Medication List    STOP taking these medications   naproxen sodium 220 MG tablet Commonly known as:  ALEVE     TAKE these medications   gabapentin 300 MG capsule Commonly known as:  NEURONTIN Take 1 capsule (300 mg total) by mouth every 8 (eight) hours. for 30 days   multivitamin with minerals Tabs tablet Take 1 tablet by mouth daily.   ondansetron 4 MG disintegrating tablet Commonly known as:  ZOFRAN-ODT Take 1 tablet (4 mg total) by mouth every 6 (six) hours as needed for nausea or vomiting.   pantoprazole 40 MG tablet Commonly known as:  PROTONIX Take 1 tablet (40 mg total) by mouth daily.   traMADol 50 MG tablet Commonly known as:  ULTRAM Take 1 tablet (50 mg total) by mouth every 6 (six) hours as needed (pain).  Discharge Care Instructions  (From admission, onward)         Start     Ordered   01/21/18 0000  Discharge wound care:    Comments:  Remove Bandaids tomorrow, ok to shower tomorrow. Steristrips may fall off in 1-3 weeks.   01/21/18 1610         Follow-up Information    Benford Asch, De Blanch, MD. Go on 02/13/2018.   Specialty:  General Surgery Why:  at [address] Contact information: [address] [place] [address] [phone]        Dwain Huhn, De Blanch, MD .   Specialty:  General Surgery Contact information: [address] [place] [address] [place] [address] [phone]             The results of significant diagnostics from this hospitalization (including imaging, microbiology, ancillary and laboratory) are listed below for reference.    Significant Diagnostic Studies: No results found.  Labs: Basic Metabolic Panel: Recent Labs  Lab 01/16/18 0918 01/21/18 0457  NA 142 138  K 3.8 3.6  CL 109 108  CO2 27 24  GLUCOSE 104* 119*  BUN 18 7  CREATININE 0.90 0.66  CALCIUM 8.9 8.3*   Liver Function Tests: Recent Labs  Lab 01/16/18 0918 01/21/18 0457  AST 15 21  ALT 15 19  ALKPHOS 50 42  BILITOT 0.6 0.9  PROT 7.2 6.2*  ALBUMIN 3.9 3.4*    CBC: Recent Labs  Lab 01/16/18 0918 01/20/18 1043 01/21/18 0457  WBC 5.9  --  7.7  NEUTROABS 2.7  --  5.0  HGB 11.2* 11.7* 10.6*  HCT 36.2 36.2 33.8*  MCV 86.6  --  85.6  PLT 289  --  271    CBG: No results for input(s): GLUCAP in the last 168 hours.  Active Problems:   Morbid obesity (HCC)  Time coordinating discharge: 

## 2018-02-04 ENCOUNTER — Encounter: Payer: Managed Care, Other (non HMO) | Attending: General Surgery | Admitting: Skilled Nursing Facility1

## 2018-02-04 DIAGNOSIS — Z713 Dietary counseling and surveillance: Secondary | ICD-10-CM | POA: Diagnosis not present

## 2018-02-06 ENCOUNTER — Encounter: Payer: Self-pay | Admitting: Skilled Nursing Facility1

## 2018-02-06 NOTE — Progress Notes (Signed)
Bariatric Class:  Appt start time: 1530 end time:  1630.  2 Week Post-Operative Nutrition Class  Patient was seen on 02/04/2018 for Post-Operative Nutrition education at [facility].   Surgery date: 01/20/2018 Surgery type: Sleeve Start weight at [facility]: 276.6 Weight today:pt declined  TANITA  BODY COMP RESULTS  Declined   BMI (kg/m^2)    Fat Mass (lbs)    Fat Free Mass (lbs)    Total Body Water (lbs)    The following the learning objectives were met by the patient during this course:  Identifies Phase 3A (Soft, High Proteins) Dietary Goals and will begin from 2 weeks post-operatively to 2 months post-operatively  Identifies appropriate sources of fluids and proteins   States protein recommendations and appropriate sources post-operatively  Identifies the need for appropriate texture modifications, mastication, and bite sizes when consuming solids  Identifies appropriate multivitamin and calcium sources post-operatively  Describes the need for physical activity post-operatively and will follow MD recommendations  States when to call healthcare provider regarding medication questions or post-operative complications  Handouts given during class include:  Phase 3A: Soft, High Protein Diet Handout  Follow-Up Plan: Patient will follow-up at [facility] in 6 weeks for 2 month post-op nutrition visit for diet advancement per MD.

## 2018-02-10 ENCOUNTER — Telehealth: Payer: Self-pay | Admitting: Skilled Nursing Facility1

## 2018-02-10 NOTE — Telephone Encounter (Signed)
Dietitian called pt to assess pts diet progression from Liquid to Solid phase.  Fluid: 60 oz  Protein: unknown grams: eating every 3 hours    Pt states she is still a little constipated having a bowel movement every 2 days.

## 2018-03-18 ENCOUNTER — Ambulatory Visit: Payer: Self-pay | Admitting: Skilled Nursing Facility1

## 2018-05-01 ENCOUNTER — Encounter: Payer: 59 | Attending: General Surgery | Admitting: Skilled Nursing Facility1

## 2018-05-01 ENCOUNTER — Encounter: Payer: Self-pay | Admitting: Skilled Nursing Facility1

## 2018-05-01 DIAGNOSIS — Z713 Dietary counseling and surveillance: Secondary | ICD-10-CM | POA: Diagnosis not present

## 2018-05-01 NOTE — Patient Instructions (Addendum)
-  Give your body a break during the week  -Try reading a page book the 30 minutes before you fall asleep  -Continue to aim for a minimum of 64 fluid ounces 7 days a week with at least 30 ounces being plain water  -Eat non-starchy vegetables 2 times a day 7 days a week  -Start out with soft cooked vegetables today and tomorrow; if tolerated begin to eat raw vegetables or cooked including salads  -Eat your 3 ounces of protein first then start in on your non-starchy vegetables; once you understand how much of your meal leads to satisfaction and not full while still eating 3 ounces of protein and non-starchy vegetables you can eat them in any order   -Continue to aim for 30 minutes of activity at least 5 times a week  -Be aware of snacking while board

## 2018-05-01 NOTE — Progress Notes (Addendum)
Follow-up visit:  8 Weeks Post-Operative Sleeve Surgery  Primary concerns today: Post-operative Bariatric Surgery Nutrition Management.   Pt states she has been having a lot of trouble sleeping. Wakes 6am then back to bed but lately to the gym and at night too; working 12:30-9 then weekend 10-7pm. In bed 1am. Pt states she accidentally had sugar in her coffee and then experienced dumping.    Surgery date: 01/20/2018 Surgery type: Sleeve Start weight at [facility]: 276.6 Weight today: 238  TANITA  BODY COMP RESULTS  05/01/2018   BMI (kg/m^2) 39   Fat Mass (lbs) 118.4   Fat Free Mass (lbs) 119.8   Total Body Water (lbs) 88    24-hr recall: 7 days a week 4 meals B (AM): 1.5 boiled egg and water or decaf coffee Snk (AM):  L (1 PM): protein shake and cheese Snk (PM):  D (4 PM): chicken or [place] or other deli meats Snk (9:30 PM): steak or fish  Fluid intake: water, minute maid juice, flavored water: 64+ Estimated total protein intake: 60+  Medications: N/A Supplementation: calcium and multi   Using straws: no Drinking while eating: no Having you been chewing well: yes Chewing/swallowing difficulties: no Changes in vision: no Changes to mood/headaches: no Hair loss/Cahnges to skin/Changes to nails: no Any difficulty focusing or concentrating: no Sweating: no Dizziness/Lightheaded:  Palpitations: no  Carbonated beverages: no N/V/D/C/GAS: no Abdominal Pain: no Dumping syndrome: no  Recent physical activity:  30 min on treadmill and 30 min stretches or resistance total of 2 hours Mon-Fri  Progress Towards Goal(s):  In progress.  Handouts given during visit include:  Non starchy veggies + protein   Nutritional Diagnosis:  [place]-3.3 Overweight/obesity related to past poor dietary habits and physical inactivity as evidenced by patient w/ recent sleeve surgery following dietary guidelines for continued weight loss.  Intervention:  Nutrition counseling. Pts diet was advanced to  the next phase now including non starchy veggies. Due to the bodies need for essential vitamins, minerals, and fats the pt was educated on the need to consume a certain amount of calories as well as certain nutrients daily. Pt was educated on the need for daily physical activity and to reach a goal of at least 150 minutes of moderate to vigorous physical activity as directed by their physician due to such benefits as increased musculature and improved lab values.   Goals: -Give your body a break during the week -Try reading a page book the 30 minutes before you fall asleep -Continue to aim for a minimum of 64 fluid ounces 7 days a week with at least 30 ounces being plain water -Eat non-starchy vegetables 2 times a day 7 days a week -Start out with soft cooked vegetables today and tomorrow; if tolerated begin to eat raw vegetables or cooked including salads -Eat your 3 ounces of protein first then start in on your non-starchy vegetables; once you understand how much of your meal leads to satisfaction and not full while still eating 3 ounces of protein and non-starchy vegetables you can eat them in any order  -Continue to aim for 30 minutes of activity at least 5 times a week -Be aware of snacking while board   Teaching Method Utilized:  Visual Auditory Hands on  Barriers to learning/adherence to lifestyle change: none stated  Demonstrated degree of understanding via:  Teach Back   Monitoring/Evaluation:  Dietary intake, exercise, and body weight. Follow up in 6 weeks

## 2018-06-12 ENCOUNTER — Ambulatory Visit: Payer: Self-pay | Admitting: Skilled Nursing Facility1

## 2018-07-24 ENCOUNTER — Other Ambulatory Visit: Payer: Self-pay

## 2018-07-24 ENCOUNTER — Emergency Department (HOSPITAL_COMMUNITY)
Admission: EM | Admit: 2018-07-24 | Discharge: 2018-07-24 | Disposition: A | Discharge status: Home / Self Care | Payer: 59 | Attending: Emergency Medicine | Admitting: Emergency Medicine

## 2018-07-24 ENCOUNTER — Encounter (HOSPITAL_COMMUNITY): Payer: Self-pay | Admitting: *Deleted

## 2018-07-24 ENCOUNTER — Emergency Department (HOSPITAL_COMMUNITY): Payer: 59

## 2018-07-24 DIAGNOSIS — R1013 Epigastric pain: Secondary | ICD-10-CM | POA: Diagnosis not present

## 2018-07-24 DIAGNOSIS — R1011 Right upper quadrant pain: Secondary | ICD-10-CM

## 2018-07-24 DIAGNOSIS — R112 Nausea with vomiting, unspecified: Secondary | ICD-10-CM | POA: Insufficient documentation

## 2018-07-24 DIAGNOSIS — K529 Noninfective gastroenteritis and colitis, unspecified: Secondary | ICD-10-CM | POA: Insufficient documentation

## 2018-07-24 DIAGNOSIS — Z79899 Other long term (current) drug therapy: Secondary | ICD-10-CM | POA: Diagnosis not present

## 2018-07-24 LAB — CBC WITH DIFFERENTIAL/PLATELET
Abs Immature Granulocytes: 0.02 10*3/uL (ref 0.00–0.07)
Basophils Absolute: 0 10*3/uL (ref 0.0–0.1)
Basophils Relative: 1 %
Eosinophils Absolute: 0.1 10*3/uL (ref 0.0–0.5)
Eosinophils Relative: 1 %
HCT: 36.8 % (ref 36.0–46.0)
Hemoglobin: 11.6 g/dL — ABNORMAL LOW (ref 12.0–15.0)
Immature Granulocytes: 0 %
Lymphocytes Relative: 45 %
Lymphs Abs: 3.5 10*3/uL (ref 0.7–4.0)
MCH: 27.9 pg (ref 26.0–34.0)
MCHC: 31.5 g/dL (ref 30.0–36.0)
MCV: 88.5 fL (ref 80.0–100.0)
Monocytes Absolute: 0.5 10*3/uL (ref 0.1–1.0)
Monocytes Relative: 7 %
Neutro Abs: 3.7 10*3/uL (ref 1.7–7.7)
Neutrophils Relative %: 46 %
Platelets: 217 10*3/uL (ref 150–400)
RBC: 4.16 MIL/uL (ref 3.87–5.11)
RDW: 13.2 % (ref 11.5–15.5)
WBC: 7.9 10*3/uL (ref 4.0–10.5)
nRBC: 0 % (ref 0.0–0.2)

## 2018-07-24 LAB — COMPREHENSIVE METABOLIC PANEL
ALT: 21 U/L (ref 0–44)
AST: 29 U/L (ref 15–41)
Albumin: 3.8 g/dL (ref 3.5–5.0)
Alkaline Phosphatase: 46 U/L (ref 38–126)
Anion gap: 8 (ref 5–15)
BUN: 14 mg/dL (ref 6–20)
CO2: 24 mmol/L (ref 22–32)
Calcium: 9 mg/dL (ref 8.9–10.3)
Chloride: 106 mmol/L (ref 98–111)
Creatinine, Ser: 0.77 mg/dL (ref 0.44–1.00)
GFR calc Af Amer: >60 mL/min (ref >60)
GFR calc non Af Amer: >60 mL/min (ref >60)
Glucose, Bld: 132 mg/dL — ABNORMAL HIGH (ref 70–99)
Potassium: 3.5 mmol/L (ref 3.5–5.1)
Sodium: 138 mmol/L (ref 135–145)
Total Bilirubin: 1.2 mg/dL (ref 0.3–1.2)
Total Protein: 6.8 g/dL (ref 6.5–8.1)

## 2018-07-24 LAB — LIPASE, BLOOD: Lipase: 32 U/L (ref 11–51)

## 2018-07-24 LAB — I-STAT BETA HCG BLOOD, ED (MC, WL, AP ONLY): I-stat hCG, quantitative: <5 m[IU]/mL (ref <5)

## 2018-07-24 MED ORDER — MORPHINE SULFATE (PF) 4 MG/ML IV SOLN
4.0000 mg | Freq: Once | INTRAVENOUS | Status: AC
  Administered 2018-07-24: 4 mg via INTRAVENOUS
  Filled 2018-07-24: qty 1

## 2018-07-24 MED ORDER — ONDANSETRON HCL 4 MG PO TABS: 4.0000 mg | ORAL_TABLET | Freq: Four times a day (QID) | ORAL | 0 refills | Status: DC

## 2018-07-24 MED ORDER — SODIUM CHLORIDE 0.9 % IV BOLUS
1000.0000 mL | Freq: Once | INTRAVENOUS | Status: AC
  Administered 2018-07-24: 1000 mL via INTRAVENOUS

## 2018-07-24 MED ORDER — ONDANSETRON HCL 4 MG/2ML IJ SOLN
4.0000 mg | Freq: Once | INTRAMUSCULAR | Status: AC
  Administered 2018-07-24: 4 mg via INTRAVENOUS
  Filled 2018-07-24: qty 2

## 2018-07-24 NOTE — ED Provider Notes (Signed)
[facility] EMERGENCY DEPARTMENT Provider Note   CSN: [id number] Arrival date & time: 07/24/18  6606    History   Chief Complaint Chief Complaint  Patient presents with  . Abdominal Pain  . Emesis    HPI Kimberly Dennis is a 33 y.o. female.     HPI    33 year old female presents today with complaints of right-sided abdominal pain.  She notes she was in her usual state of health last night.  She woke up this morning with sharp right upper and epigastric abdominal pain.  She notes associated nausea and vomiting, notes small amount of nonbloody diarrhea.  She denies any lower abdominal pain or fever.  She denies any food or drink prior to onset of symptoms.  No history of the same.  She does have a history of laparoscopic sleeve gastrectomy in October 2019.  He also notes history of cesarean section.  She denies any close sick contacts, denies any abnormal food or drink.     Past Medical History:  Diagnosis Date  . Medical history non-contributory     Patient Active Problem List   Diagnosis Date Noted  . Morbid obesity (HCC) 01/20/2018    Past Surgical History:  Procedure Laterality Date  . CESAREAN SECTION    . LAPAROSCOPIC GASTRIC SLEEVE RESECTION N/A 01/20/2018   Procedure: LAPAROSCOPIC GASTRIC SLEEVE RESECTION WITH UPPER ENDO AND ERAS PATHWAY;  Surgeon: Kinsinger, De Blanch, MD;  Location: [facility];  Service: General;  Laterality: N/A;     OB History    Gravida  2   Para  1   Term  1   Preterm      AB  1   Living  1     SAB  1   TAB      Ectopic      Multiple      Live Births  1            Home Medications    Prior to Admission medications   Medication Sig Start Date End Date Taking? Authorizing Provider  gabapentin (NEURONTIN) 300 MG capsule Take 1 capsule (300 mg total) by mouth every 8 (eight) hours. for 30 days 01/21/18   Kinsinger, De Blanch, MD  Multiple Vitamin (MULTIVITAMIN WITH MINERALS) TABS tablet Take 1  tablet by mouth daily.    [provider]  ondansetron (ZOFRAN) 4 MG tablet Take 1 tablet (4 mg total) by mouth every 6 (six) hours. 07/24/18   Jahniya Duzan, Tinnie Gens, PA-C  ondansetron (ZOFRAN-ODT) 4 MG disintegrating tablet Take 1 tablet (4 mg total) by mouth every 6 (six) hours as needed for nausea or vomiting. 01/21/18   Kinsinger, De Blanch, MD  pantoprazole (PROTONIX) 40 MG tablet Take 1 tablet (40 mg total) by mouth daily. 01/21/18   Kinsinger, De Blanch, MD  traMADol (ULTRAM) 50 MG tablet Take 1 tablet (50 mg total) by mouth every 6 (six) hours as needed (pain). 01/21/18   Kinsinger, De Blanch, MD    Family History No family history on file.  Social History Social History   Tobacco Use  . Smoking status: Never Smoker  . Smokeless tobacco: Never Used  Substance Use Topics  . Alcohol use: Yes    Comment: socially  . Drug use: Never     Allergies   Patient has no known allergies.   Review of Systems Review of Systems  All other systems reviewed and are negative.    Physical Exam Updated Vital Signs BP Marland Kitchen)  99/46 (BP Location: Right Arm)   Pulse 68   Temp 98 F (36.7 C) (Oral)   Resp 18   SpO2 99%   Physical Exam Vitals signs and nursing note reviewed.  Constitutional:      Appearance: She is well-developed.  HENT:     Head: Normocephalic and atraumatic.  Eyes:     General: No scleral icterus.       Right eye: No discharge.        Left eye: No discharge.     Conjunctiva/sclera: Conjunctivae normal.     Pupils: Pupils are equal, round, and reactive to light.  Neck:     Musculoskeletal: Normal range of motion.     Vascular: No JVD.     Trachea: No tracheal deviation.  Pulmonary:     Effort: Pulmonary effort is normal.     Breath sounds: No stridor.  Abdominal:     Comments: Right upper quadrant tenderness palpation, remainder abdomen soft nontender with no rebound or guarding  Neurological:     Mental Status: She is alert and oriented to person, place,  and time.     Coordination: Coordination normal.  Psychiatric:        Behavior: Behavior normal.        Thought Content: Thought content normal.        Judgment: Judgment normal.      ED Treatments / Results  Labs (all labs ordered are listed, but only abnormal results are displayed) Labs Reviewed  CBC WITH DIFFERENTIAL/PLATELET - Abnormal; Notable for the following components:      Result Value   Hemoglobin 11.6 (*)    All other components within normal limits  COMPREHENSIVE METABOLIC PANEL - Abnormal; Notable for the following components:   Glucose, Bld 132 (*)    All other components within normal limits  LIPASE, BLOOD  I-STAT BETA HCG BLOOD, ED ([facility], [facility], [facility] ONLY)    EKG None  Radiology US Abdomen Limited Ruq  Result Date: 07/24/2018 CLINICAL DATA:  Acute onset right upper quadrant pain for 3 hours. EXAM: ULTRASOUND ABDOMEN LIMITED RIGHT UPPER QUADRANT COMPARISON:  None. FINDINGS: Gallbladder: No gallstones or wall thickening visualized. No sonographic Murphy sign noted by sonographer. Common bile duct: Diameter: 5 mm, within normal limits. Liver: No focal lesion identified. Within normal limits in parenchymal echogenicity. Portal vein is patent on color Doppler imaging with normal direction of blood flow towards the liver. IMPRESSION: Negative.  No hepatobiliary abnormality identified. Electronically Signed   By: Myles Rosenthal M.D.   On: 07/24/2018 09:23    Procedures Procedures (including critical care time)  Medications Ordered in ED Medications  sodium chloride 0.9 % bolus 1,000 mL (0 mLs Intravenous Stopped 07/24/18 1021)  ondansetron (ZOFRAN) injection 4 mg (4 mg Intravenous Given 07/24/18 0857)  morphine 4 MG/ML injection 4 mg (4 mg Intravenous Given 07/24/18 0857)     Initial Impression / Assessment and Plan / ED Course  I have reviewed the triage vital signs and the nursing notes.  Pertinent labs & imaging results that were available during my care of the patient  were reviewed by me and considered in my medical decision making (see chart for details).        Labs: CBC, CMP, lipase, beta-hCG  Imaging: Right upper quadrant ultrasound  Consults:  Therapeutics:  Discharge Meds:   Assessment/Plan: 33 year old female presents today with likely gastroenteritis.  Patient having vomiting and diarrhea, nonbloody.  She is afebrile she has reassuring labs with no  significant elevation in white count in the right upper quadrant ultrasound that shows no significant abnormality.  She was given pain and antinausea medication here which nearly completely resolved her symptoms, on repeat abdominal exam she has no tenderness to my palpation.  And her overall well appearance I do feel she would be stable for outpatient management.  She is tolerating p.o. I have low suspicion for any acute etiology including appendicitis diverticulitis or perforations.  If patient has any new or worsening signs or symptoms she will return immediately to the emergency room for repeat evaluation.  She verbalized understanding and agreement to today's plan had no further questions or concerns at the time of discharge.   Final Clinical Impressions(s) / ED Diagnoses   Final diagnoses:  RUQ abdominal pain  Gastroenteritis    ED Discharge Orders         Ordered    ondansetron (ZOFRAN) 4 MG tablet  Every 6 hours     07/24/18 1057           Eyvonne Mechanic, PA-C 07/24/18 1108    Alvira Monday, MD 08/02/18 [phone]

## 2018-07-24 NOTE — ED Triage Notes (Signed)
C/o epigastric pain onset 630 am states pain raidates into her back, history of gastric sleeve in Sept.

## 2018-07-24 NOTE — Discharge Instructions (Addendum)
Please read attached information. If you experience any new or worsening signs or symptoms please return to the emergency room for evaluation.  If your symptoms persist please contact your primary care for repeat evaluation in 72 hours.  Please follow-up with your primary care provider or specialist as discussed. Please use medication prescribed only as directed and discontinue taking if you have any concerning signs or symptoms.

## 2019-02-11 ENCOUNTER — Other Ambulatory Visit (HOSPITAL_COMMUNITY)
Admission: RE | Admit: 2019-02-11 | Discharge: 2019-02-11 | Disposition: A | Discharge status: Home / Self Care | Payer: 59 | Source: Ambulatory Visit | Attending: Family Medicine | Admitting: Family Medicine

## 2019-02-11 ENCOUNTER — Other Ambulatory Visit: Payer: Self-pay | Admitting: Family Medicine

## 2019-02-11 DIAGNOSIS — Z0000 Encounter for general adult medical examination without abnormal findings: Secondary | ICD-10-CM | POA: Insufficient documentation

## 2019-02-11 DIAGNOSIS — B977 Papillomavirus as the cause of diseases classified elsewhere: Secondary | ICD-10-CM | POA: Insufficient documentation

## 2019-02-11 DIAGNOSIS — Z124 Encounter for screening for malignant neoplasm of cervix: Secondary | ICD-10-CM | POA: Insufficient documentation

## 2019-02-12 LAB — CYTOLOGY - PAP
Adequacy: ABSENT
Comment: NEGATIVE
Diagnosis: NEGATIVE
High risk HPV: POSITIVE — AB

## 2019-03-25 ENCOUNTER — Other Ambulatory Visit (HOSPITAL_COMMUNITY)
Admission: RE | Admit: 2019-03-25 | Discharge: 2019-03-25 | Disposition: A | Discharge status: Home / Self Care | Payer: 59 | Source: Ambulatory Visit | Attending: Nurse Practitioner | Admitting: Nurse Practitioner

## 2019-03-25 ENCOUNTER — Encounter: Payer: Self-pay | Admitting: Nurse Practitioner

## 2019-03-25 DIAGNOSIS — Z124 Encounter for screening for malignant neoplasm of cervix: Secondary | ICD-10-CM | POA: Diagnosis not present

## 2019-03-30 LAB — CYTOLOGY - PAP: Diagnosis: NEGATIVE

## 2019-05-21 IMAGING — RF DG UGI W/ KUB
10 of 11 series · 14 of 17 positions shown · non-contrast
Comparison: None.

CLINICAL DATA: Preoperative study for sleeve gastrectomy.

EXAM:
UPPER GI SERIES WITH KUB
TECHNIQUE: After obtaining a scout radiograph a routine upper GI series was
performed using thin barium.
FLUOROSCOPY TIME:  Fluoroscopy Time:  2 minutes, 30 seconds.
Radiation Exposure Index (if provided by the fluoroscopic device):
108.1 mGy.
Number of Acquired Spot Images: 6

[Series 1: t abdomen supine · 0.15mm/px · 1 of 1 slices shown (1 of 2)]
[im 1/1]
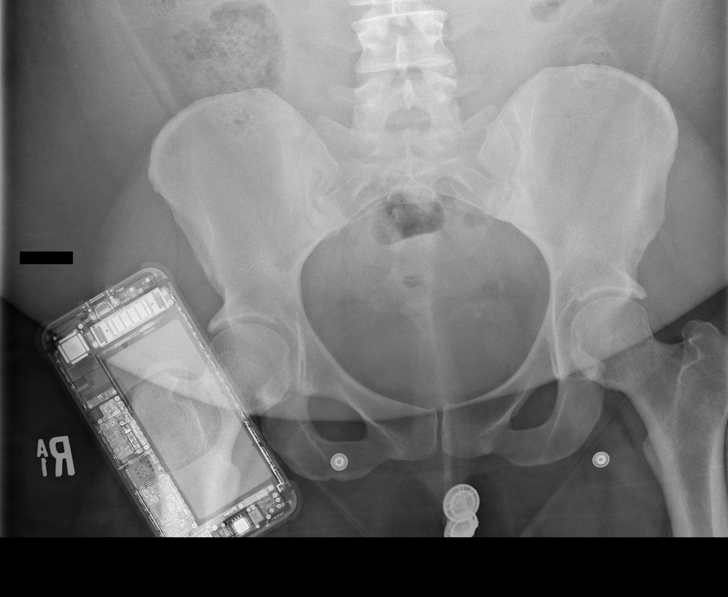

[Series 2: t abdomen supine · 0.15mm/px · 1 of 1 slices shown (2 of 2)]
[im 1/1]
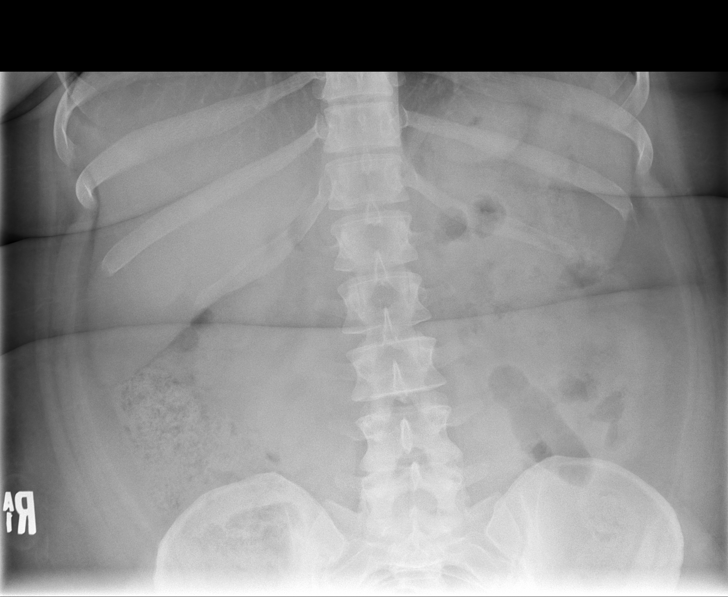

[Series 3: cp_standard · 0.51mm/px · 3 of 104 frames shown (1 of 3)]
[frame 16/104]
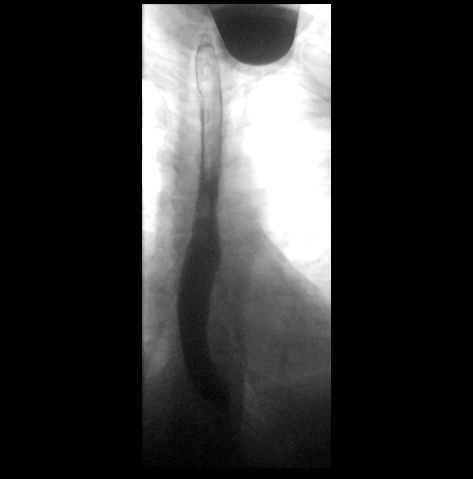
[frame 53/104]
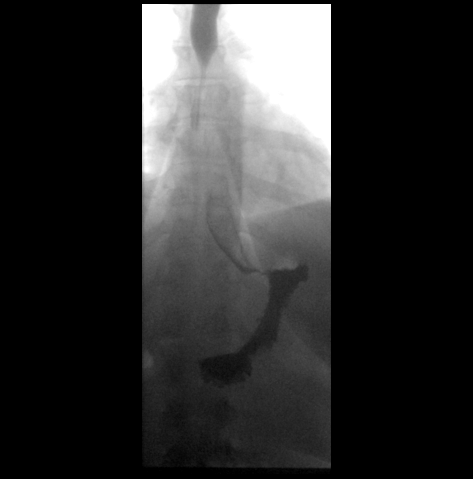
[frame 89/104]
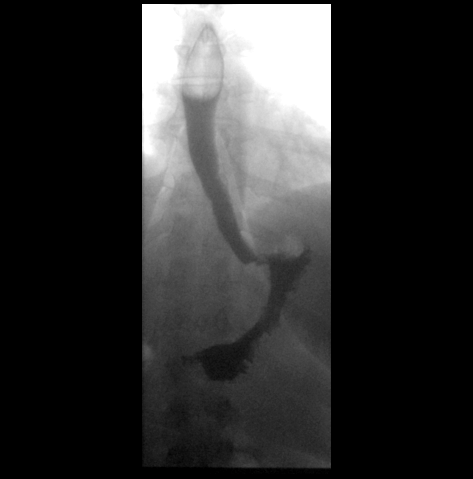

[Series 4: cp_standard · 0.35mm/px · 3 of 64 frames shown (2 of 3)]
[frame 10/64]
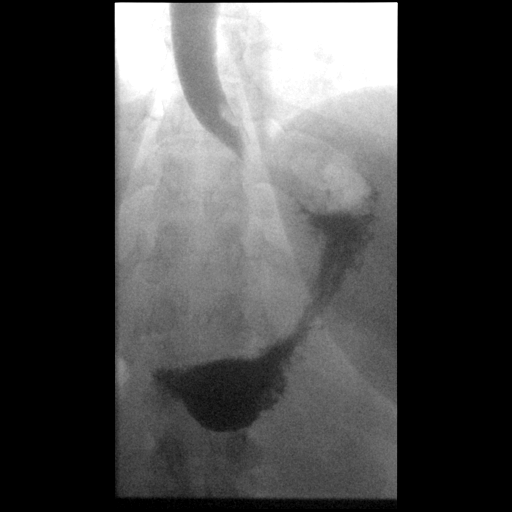
[frame 16/64]
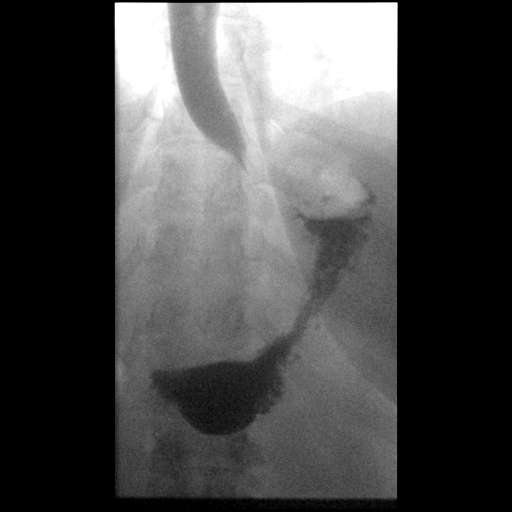
[frame 55/64]
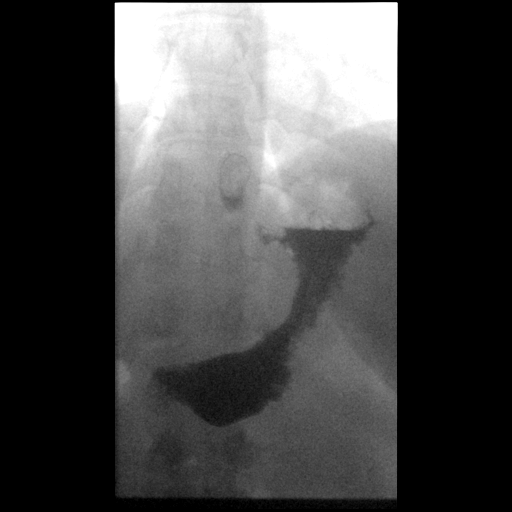

[Series 5: fluoro_barium 2fps_bw · 0.17mm/px · 1 of 1 slices shown (1 of 5)]
[im 1/1]
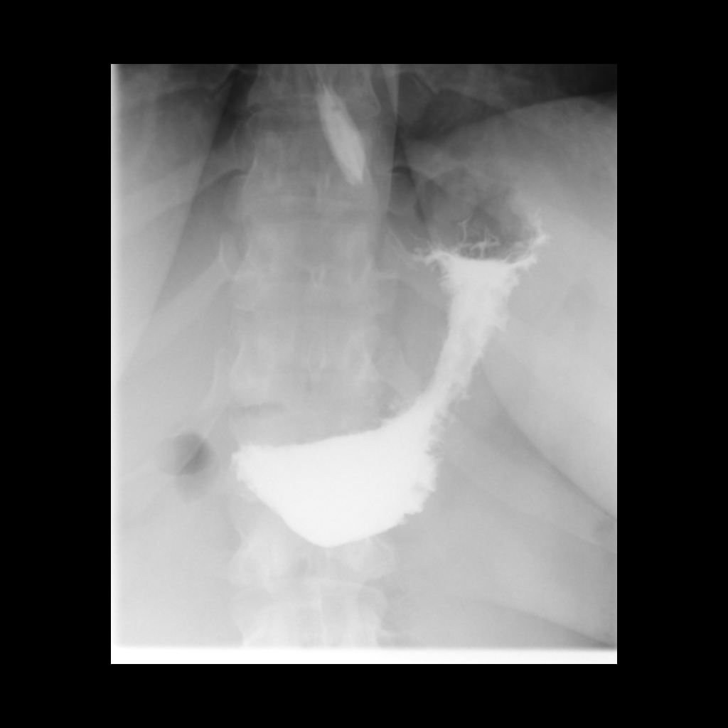

[Series 6: fluoro_barium 2fps_bw · 0.17mm/px · 1 of 1 slices shown (2 of 5)]
[im 1/1]
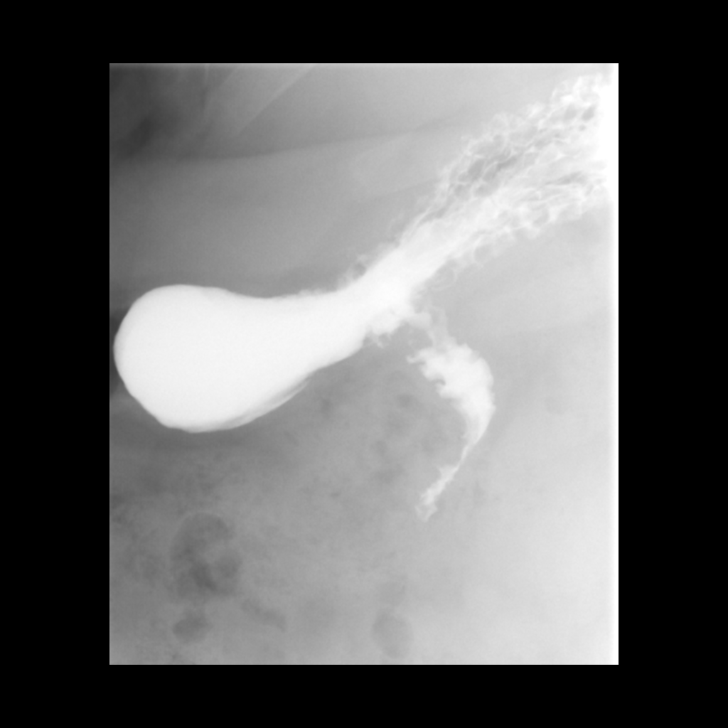

[Series 7: fluoro_barium 2fps_bw · 0.17mm/px · 1 of 1 slices shown (3 of 5)]
[im 1/1]
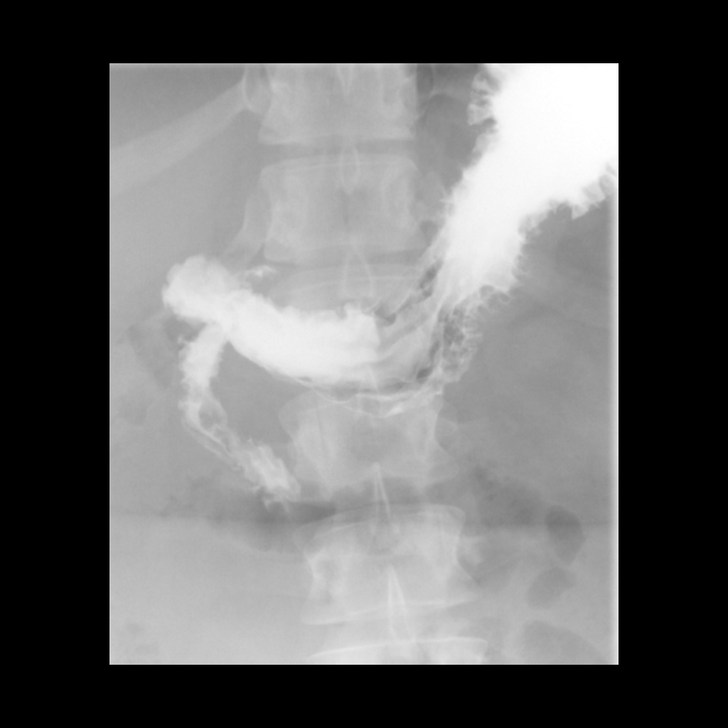

[Series 8: fluoro_barium 2fps_bw · 0.17mm/px · 1 of 1 slices shown (4 of 5)]
[im 1/1]
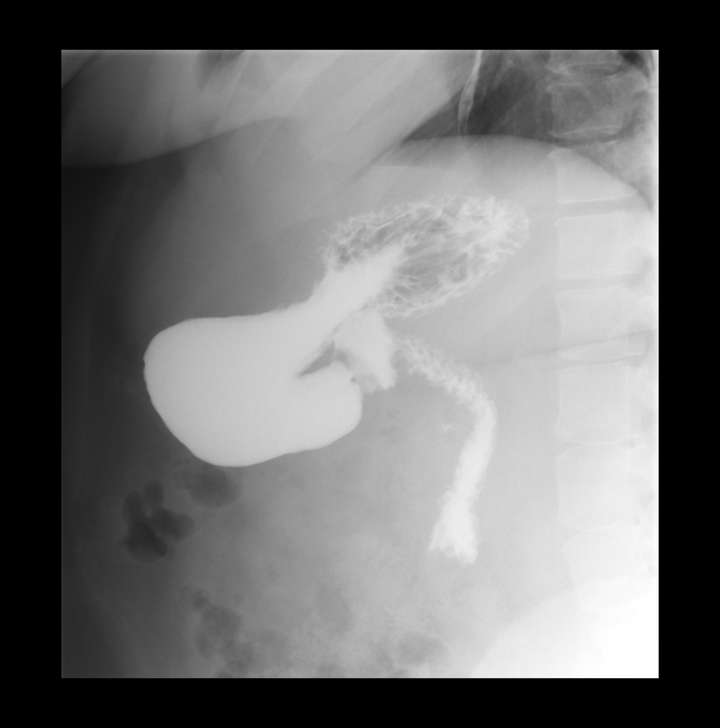

[Series 10: cp_standard · 0.26mm/px · 1 of 1 slices shown (3 of 3)]
[im 1/1]
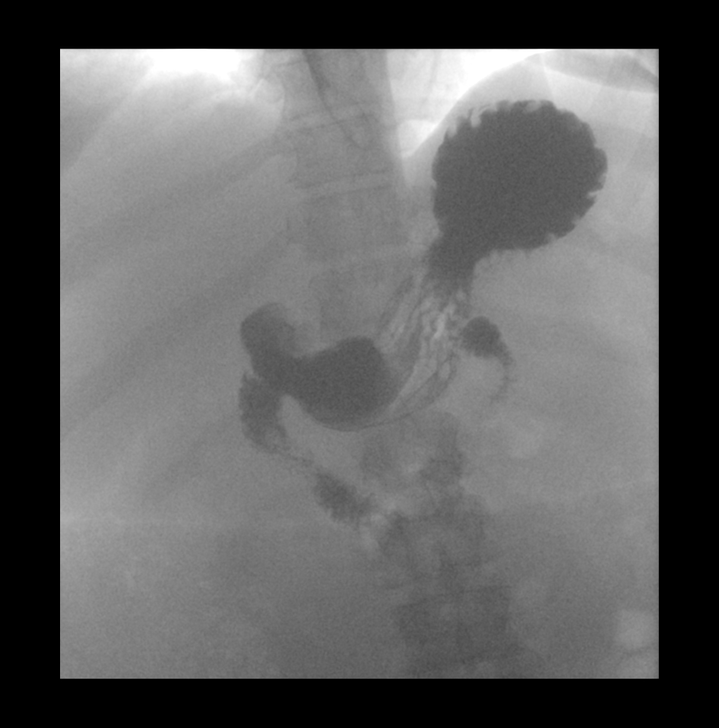

[Series 11: fluoro_barium 2fps_bw · 0.17mm/px · 1 of 1 slices shown (5 of 5)]
[im 1/1]
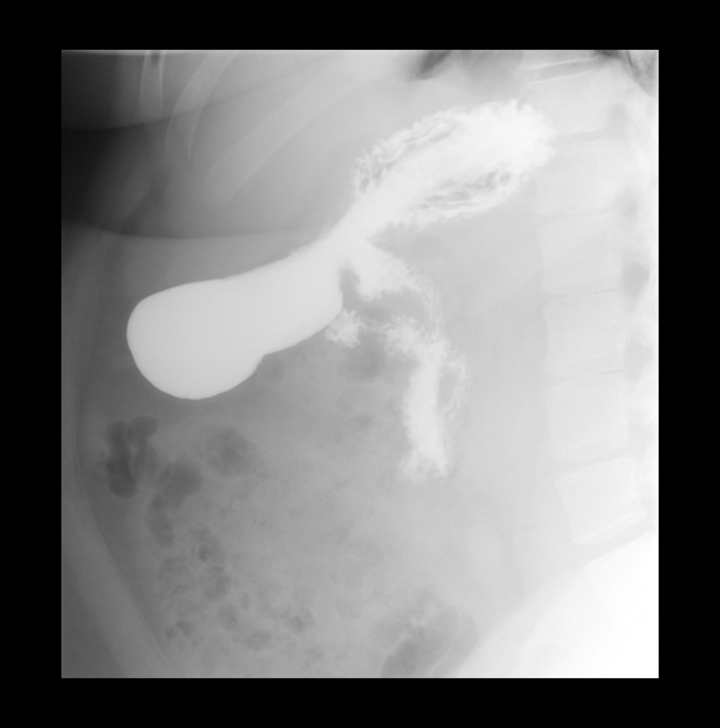

[14 of 17 positions shown; findings below may reference images not displayed]

FINDINGS: Preliminary KUB demonstrates a nonobstructive bowel gas pattern.

Primary peristaltic waves in the esophagus were normal. No
esophageal stricture, ulceration, or other significant abnormality.
No gastroesophageal reflux was noted during the course of the
examination.

Gastric morphology and appearance appear normal. No hiatal hernia.
Proximal duodenum appears normal.
IMPRESSION: Normal single-contrast upper GI.

## 2020-03-15 ENCOUNTER — Inpatient Hospital Stay (HOSPITAL_COMMUNITY)
Admission: AD | Admit: 2020-03-15 | Discharge: 2020-03-16 | Disposition: A | Discharge status: Home / Self Care | Payer: 59 | Attending: Emergency Medicine | Admitting: Emergency Medicine

## 2020-03-15 ENCOUNTER — Encounter (HOSPITAL_COMMUNITY): Payer: Self-pay | Admitting: Emergency Medicine

## 2020-03-15 ENCOUNTER — Other Ambulatory Visit: Payer: Self-pay

## 2020-03-15 DIAGNOSIS — O209 Hemorrhage in early pregnancy, unspecified: Secondary | ICD-10-CM | POA: Insufficient documentation

## 2020-03-15 DIAGNOSIS — O4691 Antepartum hemorrhage, unspecified, first trimester: Secondary | ICD-10-CM | POA: Diagnosis not present

## 2020-03-15 DIAGNOSIS — O4690 Antepartum hemorrhage, unspecified, unspecified trimester: Secondary | ICD-10-CM

## 2020-03-15 DIAGNOSIS — Z3A01 Less than 8 weeks gestation of pregnancy: Secondary | ICD-10-CM | POA: Diagnosis not present

## 2020-03-15 LAB — URINALYSIS, ROUTINE W REFLEX MICROSCOPIC
Bilirubin Urine: NEGATIVE
Glucose, UA: NEGATIVE mg/dL
Hgb urine dipstick: NEGATIVE
Ketones, ur: NEGATIVE mg/dL
Leukocytes,Ua: NEGATIVE
Nitrite: NEGATIVE
Protein, ur: NEGATIVE mg/dL
Specific Gravity, Urine: 1.023 (ref 1.005–1.030)
pH: 5 (ref 5.0–8.0)

## 2020-03-15 LAB — CBC
HCT: 36.5 % (ref 36.0–46.0)
Hemoglobin: 11.8 g/dL — ABNORMAL LOW (ref 12.0–15.0)
MCH: 29.6 pg (ref 26.0–34.0)
MCHC: 32.3 g/dL (ref 30.0–36.0)
MCV: 91.7 fL (ref 80.0–100.0)
Platelets: 258 10*3/uL (ref 150–400)
RBC: 3.98 MIL/uL (ref 3.87–5.11)
RDW: 12.9 % (ref 11.5–15.5)
WBC: 5.2 10*3/uL (ref 4.0–10.5)
nRBC: 0 % (ref 0.0–0.2)

## 2020-03-15 LAB — I-STAT BETA HCG BLOOD, ED (NOT ORDERABLE): I-stat hCG, quantitative: 185.8 m[IU]/mL — ABNORMAL HIGH (ref <5)

## 2020-03-15 NOTE — ED Triage Notes (Signed)
Patient reports vaginal bleeding this evening , she is [redacted] weeks pregnant G2P1 , denies abdominal cramping or pain , evaluated by PA at triage and will transfer to MAU .

## 2020-03-15 NOTE — MAU Note (Signed)
Spotting pink this am and now bleeding is heavier like a period.  Having some menstrual-like cramping. LMP 02/06/20

## 2020-03-15 NOTE — ED Provider Notes (Signed)
MSE was initiated and I personally evaluated the patient and placed orders (if any) at  10:24 PM on March 15, 2020.  Patient is a 34 year old G2 P1 who believes she is about [redacted] weeks pregnant.  She had a positive home pregnancy test.  Her last menstrual period was on October 16 and she states it was normal.  She reached out to [facility] earlier today and had an initial phone consultation and has her first ultrasound scheduled for December 7.  Later today she then began experiencing vaginal bleeding she describes as mild spotting that has worsened to what she describes as "like a period".  No abdominal cramping.  No clots.  No other physical complaints at this time.  Pregnancy test has been ordered.  If positive, will transfer to MAU for further evaluation.  This is been discussed with the MAU APP Shanda Bumps who is amenable.  The patient appears stable so that the remainder of the MSE may be completed by another provider.   Placido Sou, PA-C 03/15/20 2227    Geoffery Lyons, MD 03/16/20 [phone]

## 2020-03-15 NOTE — MAU Provider Note (Signed)
History     CSN: [id number]  Arrival date and time: 03/15/20 2212   None     Chief Complaint  Patient presents with  . [redacted] Weeks Pregnant/Vaginal Bleeding   Kimberly Dennis is a 34 y.o. G3P1011 at Unknown who receives care at [facility].  She presents today for [redacted] Weeks Pregnant/Vaginal Bleeding.  She states she noticed some "lightish pink" bleeding this morning when she woke up around 0900.  She states she that during the day it was light, but when she got home (around 0930) it was heavy like a period.  She denies passing of cramps and some "mild cramp."  She states upon arrival "it seemed not as heavy as it was when I was at home."  She clarifies that it is not as light as it was when she awoke this morning, but not as heavy as when she got home. Patient denies sexual activity in the past 3 days or vaginal discharge prior to the bleeding.    OB History    Gravida  3   Para  1   Term  1   Preterm      AB  1   Living  1     SAB  1   TAB      Ectopic      Multiple      Live Births  1           Past Medical History:  Diagnosis Date  . Medical history non-contributory     Past Surgical History:  Procedure Laterality Date  . CESAREAN SECTION    . 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 No pertinent family history.  Social History   Tobacco Use  . Smoking status: Never Smoker  . Smokeless tobacco: Never Used  Vaping Use  . Vaping Use: Never used  Substance Use Topics  . Alcohol use: Not Currently    Comment: socially  . Drug use: Never    Allergies: No Known Allergies  Medications Prior to Admission  Medication Sig Dispense Refill Last Dose  . gabapentin (NEURONTIN) 300 MG capsule Take 1 capsule (300 mg total) by mouth every 8 (eight)  hours. for 30 days 50 capsule 0   . Multiple Vitamin (MULTIVITAMIN WITH MINERALS) TABS tablet Take 1 tablet by mouth daily.     . ondansetron (ZOFRAN) 4 MG tablet Take 1 tablet (4 mg total) by mouth every 6 (six) hours. 12 tablet 0   . ondansetron (ZOFRAN-ODT) 4 MG disintegrating tablet Take 1 tablet (4 mg total) by mouth every 6 (six) hours as needed for nausea or vomiting. 20 tablet 0   . pantoprazole (PROTONIX) 40 MG tablet Take 1 tablet (40 mg total) by mouth daily. 90 tablet 0   . traMADol (ULTRAM) 50 MG tablet Take 1 tablet (50 mg total) by mouth every 6 (six) hours as needed (pain). 30 tablet 0     Review of Systems  Constitutional: Negative for chills and fever.  Respiratory: Negative for cough and shortness of breath.   Gastrointestinal: Negative for abdominal pain, constipation, diarrhea, nausea and vomiting.  Genitourinary: Positive for vaginal bleeding. Negative for difficulty urinating, dysuria, pelvic pain and vaginal discharge.  Neurological: Negative for dizziness, light-headedness and headaches.   Physical Exam   Blood pressure Marland Kitchen)  114/56, pulse 72, temperature 98.5 F (36.9 C), resp. rate 16, height 5\' 6"  (1.676 m), weight 84.4 kg, last menstrual period 02/06/2020, SpO2 100 %.  Physical Exam Vitals and nursing note reviewed. Exam conducted with a chaperone present.  Constitutional:      Appearance: Normal appearance.  HENT:     Head: Normocephalic and atraumatic.  Eyes:     Conjunctiva/sclera: Conjunctivae normal.  Cardiovascular:     Rate and Rhythm: Normal rate and regular rhythm.     Heart sounds: Normal heart sounds.  Pulmonary:     Effort: Pulmonary effort is normal. No respiratory distress.     Breath sounds: Normal breath sounds.  Abdominal:     Palpations: Abdomen is soft.  Genitourinary:    Vagina: Bleeding present. No vaginal discharge or tenderness.     Cervix: Discharge and cervical bleeding present. No cervical motion tenderness.     Uterus:  Normal. Not enlarged.      Comments: Speculum Exam: -Normal External Genitalia: Non tender, no apparent discharge at introitus.  -Vaginal Vault: Pink mucosa with good rugae. Small amt blood in vault removed with faux swab x 1 -wet prep collected -Cervix:Pink, no lesions, cysts, or polyps.  Appears closed. Bloody mucoid discharge from os-GC/CT collected -Bimanual Exam:  Anteverted/flexed uterus, NT, No apparent enlargement.  Musculoskeletal:        General: Normal range of motion.     Cervical back: Normal range of motion.  Neurological:     Mental Status: She is alert and oriented to person, place, and time.  Psychiatric:        Mood and Affect: Mood normal.        Behavior: Behavior normal.        Thought Content: Thought content normal.     MAU Course  Procedures Results for orders placed or performed during the hospital encounter of 03/15/20 (from the past 24 hour(s))  I-Stat beta hCG blood, ED     Status: Abnormal   Collection Time: 03/15/20 10:45 PM  Result Value Ref Range   I-stat hCG, quantitative 185.8 (H) <5 mIU/mL   Comment 3          Urinalysis, Routine w reflex microscopic Urine, Clean Catch     Status: None   Collection Time: 03/15/20 10:55 PM  Result Value Ref Range   Color, Urine YELLOW YELLOW   APPearance CLEAR CLEAR   Specific Gravity, Urine 1.023 1.005 - 1.030   pH 5.0 5.0 - 8.0   Glucose, UA NEGATIVE NEGATIVE mg/dL   Hgb urine dipstick NEGATIVE NEGATIVE   Bilirubin Urine NEGATIVE NEGATIVE   Ketones, ur NEGATIVE NEGATIVE mg/dL   Protein, ur NEGATIVE NEGATIVE mg/dL   Nitrite NEGATIVE NEGATIVE   Leukocytes,Ua NEGATIVE NEGATIVE  CBC     Status: Abnormal   Collection Time: 03/15/20 11:08 PM  Result Value Ref Range   WBC 5.2 4.0 - 10.5 K/uL   RBC 3.98 3.87 - 5.11 MIL/uL   Hemoglobin 11.8 (L) 12.0 - 15.0 g/dL   HCT 03/17/20 36 - 46 %   MCV 91.7 80.0 - 100.0 fL   MCH 29.6 26.0 - 34.0 pg   MCHC 32.3 30.0 - 36.0 g/dL   RDW 42.5 95.6 - 38.7 %   Platelets 258  150 - 400 K/uL   nRBC 0.0 0.0 - 0.2 %  hCG, quantitative, pregnancy     Status: Abnormal   Collection Time: 03/15/20 11:08 PM  Result Value Ref Range   hCG, Beta Chain,  Quant, S 220 (H) <5 mIU/mL  Wet prep, genital     Status: Abnormal   Collection Time: 03/15/20 11:52 PM   Specimen: PATH Cytology Cervicovaginal Ancillary Only  Result Value Ref Range   Yeast Wet Prep HPF POC NONE SEEN NONE SEEN   Trich, Wet Prep NONE SEEN NONE SEEN   Clue Cells Wet Prep HPF POC NONE SEEN NONE SEEN   WBC, Wet Prep HPF POC FEW (A) NONE SEEN   Sperm NONE SEEN    US OB LESS THAN 14 WEEKS WITH OB TRANSVAGINAL  Result Date: 03/16/2020 CLINICAL DATA:  Vaginal bleeding since this morning. Quantitative beta HCG is not provided. Estimated gestational age by LMP is 5 weeks 4 days. EXAM: OBSTETRIC <14 WK [place] AND TRANSVAGINAL OB US TECHNIQUE: Both transabdominal and transvaginal ultrasound examinations were performed for complete evaluation of the gestation as well as the maternal uterus, adnexal regions, and pelvic cul-de-sac. Transvaginal technique was performed to assess early pregnancy. COMPARISON:  None. FINDINGS: Intrauterine gestational sac: A small cystic structure is demonstrated in the endometrial canal, possibly representing an early intrauterine gestational sac. Yolk sac:  Not identified. Embryo:  Not identified. Cardiac Activity: Not identified. MSD: 3 mm   4 w   6 d Subchorionic hemorrhage:  None visualized. Maternal uterus/adnexae: The uterus is anteverted. No myometrial mass lesions are identified. Both ovaries are visualized and appear normal. No abnormal adnexal masses. No free fluid in the pelvis. IMPRESSION: Possible early intrauterine gestational sac, but no yolk sac, fetal pole, or cardiac activity yet visualized. Recommend follow-up quantitative B-HCG levels and follow-up US in 14 days to assess viability. This recommendation follows SRU consensus guidelines: Diagnostic Criteria for Nonviable Pregnancy  Early in the First Trimester. Malva Limes Med 2013; 270:3500-93. Electronically Signed   By: Burman Nieves M.D.   On: 03/16/2020 00:30    MDM Pelvic Exam; Wet Prep and GC/CT Labs: UA, UPT, CBC, hCG, Ultrasound Assessment and Plan  34 year old G3P1011 at 5.4weeks Vaginal Bleeding  -Reviewed POC with patient. -Exam performed and findings discussed.  -Cultures collected and pending.  -Discussed how follow up will likely be necessary, after visit, considering early GA and stat hCG of 185 -Will send for [place] and await results.    Cherre Robins 03/15/2020, 11:39 PM   Reassessment (1:19 AM)  -[place] results return as above. -Provider to bedside to discuss. -Patient questions if she is having a miscarriage. -Informed that findings significant for threatened miscarriage, but provider does not know what outcome will be. -Reviewed bleeding precautions and need for follow up in 48 hours at MAU since holiday. -Patient verbalized understanding and without further questions or concerns. -Encouraged to call or return to MAU if symptoms worsen or with the onset of new symptoms. -Discharged to home in stable condition.  Cherre Robins MSN, CNM Advanced Practice Provider, [facility] [employer]

## 2020-03-16 ENCOUNTER — Inpatient Hospital Stay (HOSPITAL_COMMUNITY): Payer: 59

## 2020-03-16 LAB — GC/CHLAMYDIA PROBE AMP (~~LOC~~) NOT AT ARMC
Chlamydia: NEGATIVE
Comment: NEGATIVE
Comment: NORMAL
Neisseria Gonorrhea: NEGATIVE

## 2020-03-16 LAB — WET PREP, GENITAL
Clue Cells Wet Prep HPF POC: NONE SEEN
Sperm: NONE SEEN
Trich, Wet Prep: NONE SEEN
Yeast Wet Prep HPF POC: NONE SEEN

## 2020-03-16 LAB — HCG, QUANTITATIVE, PREGNANCY: hCG, Beta Chain, Quant, S: 220 m[IU]/mL — ABNORMAL HIGH (ref <5)

## 2020-03-16 NOTE — Discharge Instructions (Signed)
Threatened Miscarriage  A threatened miscarriage occurs when a woman has vaginal bleeding during the first 20 weeks of pregnancy but the pregnancy has not ended. If you have vaginal bleeding during this time, your health care provider will do tests to make sure you are still pregnant. If the tests show that you are still pregnant and that the developing baby (fetus) inside your uterus is still growing, your condition is considered a threatened miscarriage. A threatened miscarriage does not mean your pregnancy will end, but it does increase the risk of losing your pregnancy (complete miscarriage). What are the causes? The cause of this condition is usually not known. For women who go on to have a complete miscarriage, the most common cause is an abnormal number of chromosomes in the developing baby. Chromosomes are the structures inside cells that hold all of a person's genetic material. What increases the risk? The following lifestyle factors may increase your risk of a miscarriage in early pregnancy:  Smoking.  Drinking excessive amounts of alcohol or caffeine.  Recreational drug use. The following preexisting health conditions may increase your risk of a miscarriage in early pregnancy:  Polycystic ovary syndrome.  Uterine fibroids.  Infections.  Diabetes mellitus. What are the signs or symptoms? Symptoms of this condition include:  Vaginal bleeding.  Mild abdominal pain or cramps. How is this diagnosed? If you have bleeding with or without abdominal pain before 20 weeks of pregnancy, your health care provider will do tests to check whether you are still pregnant. These will include:  Ultrasound. This test uses sound waves to create images of the inside of your uterus. This allows your health care provider to look at your developing baby and other structures, such as your placenta.  Pelvic exam. This is an internal exam of your vagina and cervix.  Measurement of your baby's heart  rate.  Laboratory tests such as blood tests, urine tests, or swabs for infection You may be diagnosed with a threatened miscarriage if:  Ultrasound testing shows that you are still pregnant.  Your baby's heart rate is strong.  A pelvic exam shows that the opening between your uterus and your vagina (cervix) is closed.  Blood tests confirm that you are still pregnant. How is this treated? No treatments have been shown to prevent a threatened miscarriage from going on to a complete miscarriage. However, the right home care is important. Follow these instructions at home:  Get plenty of rest.  Do not have sex or use tampons if you have vaginal bleeding.  Do not douche.  Do not smoke or use recreational drugs.  Do not drink alcohol.  Avoid caffeine.  Keep all follow-up prenatal visits as told by your health care provider. This is important. Contact a health care provider if:  You have light vaginal bleeding or spotting while pregnant.  You have abdominal pain or cramping.  You have a fever. Get help right away if:  You have heavy vaginal bleeding.  You have blood clots coming from your vagina.  You pass tissue from your vagina.  You leak fluid, or you have a gush of fluid from your vagina.  You have severe low back pain or abdominal cramps.  You have fever, chills, and severe abdominal pain. Summary  A threatened miscarriage occurs when a woman has vaginal bleeding during the first 20 weeks of pregnancy but the pregnancy has not ended.  The cause of a threatened miscarriage is usually not known.  Symptoms of this condition may   include vaginal bleeding and mild abdominal pain or cramps.  No treatments have been shown to prevent a threatened miscarriage from going on to a complete miscarriage.  Keep all follow-up prenatal visits as told by your health care provider. This is important. This information is not intended to replace advice given to you by your health  care provider. Make sure you discuss any questions you have with your health care provider. Document Revised: 05/16/2017 Document Reviewed: 07/06/2016 Elsevier Patient Education  2020 [employer]  

## 2020-03-18 ENCOUNTER — Other Ambulatory Visit: Payer: Self-pay

## 2020-03-18 ENCOUNTER — Inpatient Hospital Stay (HOSPITAL_COMMUNITY)
Admission: AD | Admit: 2020-03-18 | Discharge: 2020-03-18 | Disposition: A | Discharge status: Home / Self Care | Payer: 59 | Attending: Obstetrics & Gynecology | Admitting: Obstetrics & Gynecology

## 2020-03-18 DIAGNOSIS — O039 Complete or unspecified spontaneous abortion without complication: Secondary | ICD-10-CM | POA: Diagnosis not present

## 2020-03-18 DIAGNOSIS — Z3A01 Less than 8 weeks gestation of pregnancy: Secondary | ICD-10-CM | POA: Insufficient documentation

## 2020-03-18 LAB — HCG, QUANTITATIVE, PREGNANCY: hCG, Beta Chain, Quant, S: 26 m[IU]/mL — ABNORMAL HIGH (ref <5)

## 2020-03-18 NOTE — Discharge Instructions (Signed)
Miscarriage A miscarriage is the loss of an unborn baby (fetus) before the 20th week of pregnancy. Follow these instructions at home: Medicines   Take over-the-counter and prescription medicines only as told by your doctor.  If you were prescribed antibiotic medicine, take it as told by your doctor. Do not stop taking the antibiotic even if you start to feel better.  Do not take NSAIDs unless your doctor says that this is safe for you. NSAIDs include aspirin and ibuprofen. These medicines can cause bleeding. Activity  Rest as directed. Ask your doctor what activities are safe for you.  Have someone help you at home during this time. General instructions  Write down how many pads you use each day and how soaked they are.  Watch the amount of tissue or clumps of blood (blood clots) that you pass from your vagina. Save any large amounts of tissue for your doctor.  Do not use tampons, douche, or have sex until your doctor approves.  To help you and your partner with the process of grieving, talk with your doctor or seek counseling.  When you are ready, meet with your doctor to talk about steps you should take for your health. Also, talk with your doctor about steps to take to have a healthy pregnancy in the future.  Keep all follow-up visits as told by your doctor. This is important. Contact a doctor if:  You have a fever or chills.  You have vaginal discharge that smells bad.  You have more bleeding. Get help right away if:  You have very bad cramps or pain in your back or belly.  You pass clumps of blood that are walnut-sized or larger from your vagina.  You pass tissue that is walnut-sized or larger from your vagina.  You soak more than 1 regular pad in an hour.  You get light-headed or weak.  You faint (pass out).  You have feelings of sadness that do not go away, or you have thoughts of hurting yourself. Summary  A miscarriage is the loss of an unborn baby before  the 20th week of pregnancy.  Follow your doctor's instructions for home care. Keep all follow-up appointments.  To help you and your partner with the process of grieving, talk with your doctor or seek counseling. This information is not intended to replace advice given to you by your health care provider. Make sure you discuss any questions you have with your health care provider. Document Revised: 08/01/2018 Document Reviewed: 05/15/2016 Elsevier Patient Education  2020 Elsevier Inc.   Managing Pregnancy Loss Pregnancy loss can happen any time during a pregnancy. Often the cause is not known. It is rarely because of anything you did. Pregnancy loss in early pregnancy (during the first trimester) is called a miscarriage. This type of pregnancy loss is the most common. Pregnancy loss that happens after 20 weeks of pregnancy is called fetal demise if the baby's heart stops beating before birth. Fetal demise is much less common. Some women experience spontaneous labor shortly after fetal demise resulting in a stillborn birth (stillbirth). Any pregnancy loss can be devastating. You will need to recover both physically and emotionally. Most women are able to get pregnant again after a pregnancy loss and deliver a healthy baby. How to manage emotional recovery  Pregnancy loss is very hard emotionally. You may feel many different emotions while you grieve. You may feel sad and angry. You may also feel guilty. It is normal to have periods of crying.   Emotional recovery can take longer than physical recovery. It is different for everyone. Taking these steps can help you in managing this loss:  Remember that it is unlikely you did anything to cause the pregnancy loss.  Share your thoughts and feelings with friends, family, and your partner. Remember that your partner is also recovering emotionally.  Make sure you have a good support system. Do not spend too much time alone.  Meet with a pregnancy loss  counselor or join a pregnancy loss support group.  Get enough sleep and eat a healthy diet. Return to regular exercise when you have recovered physically.  Do not use drugs or alcohol to manage your emotions.  Consider seeing a mental health professional to help you recover emotionally.  Ask a friend or loved one to help you decide what to do with any clothing and nursery items you received for your baby. In the case of a stillbirth, many women benefit from taking additional steps in the grieving process. You may want to:  Hold your baby after the birth.  Name your baby.  Request a birth certificate.  Create a keepsake such as handprints or footprints.  Dress your baby and have a picture taken.  Make funeral arrangements.  Ask for a baptism or blessing. Hospitals have staff members who can help you with all these arrangements. How to recognize emotional stress It is normal to have emotional stress after a pregnancy loss. But emotional stress that lasts a long time or becomes severe requires treatment. Watch out for these signs of severe emotional stress:  Sadness, anger, or guilt that is not going away and is interfering with your normal activities.  Relationship problems that have occurred or gotten worse since the pregnancy loss.  Signs of depression that last longer than 2 weeks. These may include: ? Sadness. ? Anxiety. ? Hopelessness. ? Loss of interest in activities you enjoy. ? Inability to concentrate. ? Trouble sleeping or sleeping too much. ? Loss of appetite or overeating. ? Thoughts of death or of hurting yourself. Follow these instructions at home:  Take over-the-counter and prescription medicines only as told by your health care provider.  Rest at home until your energy level returns. Return to your normal activities as told by your health care provider. Ask your health care provider what activities are safe for you.  When you are ready, meet with your  health care provider to discuss steps to take for a future pregnancy.  Keep all follow-up visits as told by your health care provider. This is important. Where to find support  To help you and your partner with the process of grieving, talk with your health care provider or seek counseling.  Consider meeting with others who have experienced pregnancy loss. Ask your health care provider about support groups and resources. Where to find more information  [place] Department of Health and Human Services Office on Women's Health: www.womenshealth.gov  American Pregnancy Association: www.americanpregnancy.org Contact a health care provider if:  You continue to experience grief, sadness, or lack of motivation for everyday activities, and those feelings do not improve over time.  You are struggling to recover emotionally, especially if you are using alcohol or substances to help. Get help right away if:  You have thoughts of hurting yourself or others. If you ever feel like you may hurt yourself or others, or have thoughts about taking your own life, get help right away. You can go to your nearest emergency department or call:    Your local emergency services (911 in the [place]  A suicide crisis helpline, such as the National Suicide Prevention Lifeline at 1-800-273-8255. This is open 24 hours a day. Summary  Any pregnancy loss can be difficult physically and emotionally.  You may experience many different emotions while you grieve. Emotional recovery can last longer than physical recovery.  It is normal to have emotional stress after a pregnancy loss. But emotional stress that lasts a long time or becomes severe requires treatment.  See your health care provider if you are struggling emotionally after a pregnancy loss. This information is not intended to replace advice given to you by your health care provider. Make sure you discuss any questions you have with your health care  provider. Document Revised: 07/30/2018 Document Reviewed: 06/20/2017 Elsevier Patient Education  2020 Elsevier Inc.  

## 2020-03-18 NOTE — MAU Note (Signed)
Pt here for f/u BHCG . reports vag bleeding is less. Passed a clot on Wednesday  . Denies any pain or cramping today.

## 2020-03-21 NOTE — MAU Provider Note (Signed)
Provider established contact with patient at 1530   S Ms. Kimberly Dennis is a 34 y.o. G93P1011 female at [redacted]w[redacted]d who presents to MAU today for a follow up bHCG. Reports lighter bleeding than 3 days ago, passed a clot on Wednesday but has had only spotting today. Tearful with news of her miscarriage, but able to discuss and ask appropriate questions.  O BP 118/69   Pulse 64   Temp 98.5 F (36.9 C)   Resp 18   Ht 5\' 6"  (1.676 m)   Wt 188 lb (85.3 kg)   LMP 02/06/2020   BMI 30.34 kg/m  Physical Exam Vitals and nursing note reviewed.  Constitutional:      Appearance: Normal appearance. She is normal weight.  Eyes:     Pupils: Pupils are equal, round, and reactive to light.  Cardiovascular:     Rate and Rhythm: Normal rate.     Pulses: Normal pulses.  Pulmonary:     Effort: Pulmonary effort is normal.  Musculoskeletal:        General: Normal range of motion.  Skin:    General: Skin is warm and dry.     Capillary Refill: Capillary refill takes less than 2 seconds.  Neurological:     Mental Status: She is alert and oriented to person, place, and time.  Psychiatric:        Mood and Affect: Mood normal.        Behavior: Behavior normal.        Thought Content: Thought content normal.        Judgment: Judgment normal.    Quantitative HCG fallen from 220 on 03/15/20 to 26 today (03/18/20)  A Miscarriage Medical screening exam complete  P Discharge from MAU in stable condition Advised to follow up with community [facility] in one week for bloodwork and two weeks for in-person appointment. Provider notified. Warning signs for worsening condition that would warrant emergency follow-up discussed Patient may return to MAU as needed for emergent OB/GYN related complaints  03/20/20, CNM 03/21/2020 12:05 PM

## 2020-08-08 ENCOUNTER — Encounter (HOSPITAL_COMMUNITY): Payer: Self-pay | Admitting: *Deleted

## 2020-12-10 LAB — OB RESULTS CONSOLE HIV ANTIBODY (ROUTINE TESTING): HIV: NONREACTIVE

## 2020-12-10 LAB — OB RESULTS CONSOLE GC/CHLAMYDIA
Chlamydia: NEGATIVE
Gonorrhea: NEGATIVE

## 2020-12-10 LAB — OB RESULTS CONSOLE RPR: RPR: NONREACTIVE

## 2020-12-10 LAB — OB RESULTS CONSOLE ABO/RH: "RH Type ": POSITIVE

## 2020-12-10 LAB — OB RESULTS CONSOLE ANTIBODY SCREEN: Antibody Screen: NEGATIVE

## 2020-12-10 LAB — OB RESULTS CONSOLE RUBELLA ANTIBODY, IGM: Rubella: IMMUNE

## 2020-12-10 LAB — OB RESULTS CONSOLE HEPATITIS B SURFACE ANTIGEN: Hepatitis B Surface Ag: NEGATIVE

## 2020-12-10 LAB — HEPATITIS C ANTIBODY: HCV Ab: NEGATIVE

## 2021-01-07 ENCOUNTER — Inpatient Hospital Stay (HOSPITAL_BASED_OUTPATIENT_CLINIC_OR_DEPARTMENT_OTHER): Payer: 59

## 2021-01-07 ENCOUNTER — Inpatient Hospital Stay (HOSPITAL_COMMUNITY)
Admission: EM | Admit: 2021-01-07 | Discharge: 2021-01-07 | Disposition: A | Discharge status: Home / Self Care | Payer: 59 | Attending: Obstetrics and Gynecology | Admitting: Obstetrics and Gynecology

## 2021-01-07 ENCOUNTER — Encounter (HOSPITAL_COMMUNITY): Payer: Self-pay

## 2021-01-07 ENCOUNTER — Other Ambulatory Visit: Payer: Self-pay

## 2021-01-07 DIAGNOSIS — O209 Hemorrhage in early pregnancy, unspecified: Secondary | ICD-10-CM | POA: Insufficient documentation

## 2021-01-07 DIAGNOSIS — R519 Headache, unspecified: Secondary | ICD-10-CM | POA: Insufficient documentation

## 2021-01-07 DIAGNOSIS — Z3A14 14 weeks gestation of pregnancy: Secondary | ICD-10-CM

## 2021-01-07 DIAGNOSIS — O4691 Antepartum hemorrhage, unspecified, first trimester: Secondary | ICD-10-CM

## 2021-01-07 DIAGNOSIS — O26892 Other specified pregnancy related conditions, second trimester: Secondary | ICD-10-CM | POA: Insufficient documentation

## 2021-01-07 DIAGNOSIS — O4692 Antepartum hemorrhage, unspecified, second trimester: Secondary | ICD-10-CM

## 2021-01-07 DIAGNOSIS — O4690 Antepartum hemorrhage, unspecified, unspecified trimester: Secondary | ICD-10-CM

## 2021-01-07 DIAGNOSIS — O34219 Maternal care for unspecified type scar from previous cesarean delivery: Secondary | ICD-10-CM | POA: Diagnosis not present

## 2021-01-07 DIAGNOSIS — O468X2 Other antepartum hemorrhage, second trimester: Secondary | ICD-10-CM

## 2021-01-07 DIAGNOSIS — O418X20 Other specified disorders of amniotic fluid and membranes, second trimester, not applicable or unspecified: Secondary | ICD-10-CM

## 2021-01-07 DIAGNOSIS — IMO0001 Reserved for inherently not codable concepts without codable children: Secondary | ICD-10-CM

## 2021-01-07 LAB — I-STAT BETA HCG BLOOD, ED (MC, WL, AP ONLY): I-stat hCG, quantitative: >2000 m[IU]/mL — ABNORMAL HIGH (ref <5)

## 2021-01-07 LAB — WET PREP, GENITAL
Sperm: NONE SEEN
Trich, Wet Prep: NONE SEEN
WBC, Wet Prep HPF POC: NONE SEEN
Yeast Wet Prep HPF POC: NONE SEEN

## 2021-01-07 LAB — HCG, QUANTITATIVE, PREGNANCY: hCG, Beta Chain, Quant, S: 17129 m[IU]/mL — ABNORMAL HIGH (ref <5)

## 2021-01-07 LAB — CBC
HCT: 33.3 % — ABNORMAL LOW (ref 36.0–46.0)
Hemoglobin: 10.9 g/dL — ABNORMAL LOW (ref 12.0–15.0)
MCH: 30.1 pg (ref 26.0–34.0)
MCHC: 32.7 g/dL (ref 30.0–36.0)
MCV: 92 fL (ref 80.0–100.0)
Platelets: 227 10*3/uL (ref 150–400)
RBC: 3.62 MIL/uL — ABNORMAL LOW (ref 3.87–5.11)
RDW: 13.1 % (ref 11.5–15.5)
WBC: 6.8 10*3/uL (ref 4.0–10.5)
nRBC: 0 % (ref 0.0–0.2)

## 2021-01-07 NOTE — MAU Note (Signed)
Vaginal bleeding started at midnight.  Went through underwear.  Soaked 2 pads.  Arriving here, bleeding is less, half dollar size on pad. No clots.  Low back pain since early in week, worse with standing low periods of time.

## 2021-01-07 NOTE — ED Notes (Signed)
Report called to MAU. Transport notified.

## 2021-01-07 NOTE — MAU Note (Signed)
PT obtained vaginal swabs without difficulty

## 2021-01-07 NOTE — ED Provider Notes (Signed)
Emergency Medicine Provider OB Triage Evaluation Note  Kimberly Dennis is a 35 y.o. female, G3P1011, at estimated [redacted] weeks gestation who presents to the emergency department with complaints of vaginal bleeding that began shortly PTA. Mild amount, improving, no passage of clots.   Review of  Systems  Positive: vaginal bleeding Negative: abdominal pain, vomiting  Physical Exam  BP (!) 105/55   Pulse 76   Temp 98.6 F (37 C) (Oral)   Resp 16   LMP 02/06/2020   SpO2 100%  General: Awake, no distress  HEENT: Atraumatic  Resp: Normal effort  Cardiac: Normal rate Abd: Nondistended, nontender  MSK: Moves all extremities without difficulty Neuro: Speech clear  Medical Decision Making  Pt evaluated for pregnancy concern and is stable for transfer to MAU. Pt is in agreement with plan for transfer.  3:43 AM Discussed with MAU APP,Jessica, who accepts patient in transfer.  Clinical Impression  Vaginal bleeding in pregnancy.      Desmond Lope 01/07/21 0701    Tilden Fossa, MD 01/07/21 [phone]

## 2021-01-07 NOTE — MAU Note (Signed)
Pt had vag bleeding about . Went to main ED and was transferred to MAU. Continues to have some vag bleeding with no pain. No recent intercourse.

## 2021-01-07 NOTE — MAU Provider Note (Signed)
History     CSN: [id number]  Arrival date and time: 01/07/21 0020   Event Date/Time   First Provider Initiated Contact with Patient 01/07/21 0541      Chief Complaint  Patient presents with   Vaginal Bleeding   Kimberly Dennis is a 35 y.o. G4P1011 at [redacted]w[redacted]d by unsure LMP of September 27, 2020 who receives care at [facility]  She presents today for Vaginal Bleeding.  She reports she took a shower around midnight and after had some bleeding that "soaked my underwear."  She denies cramping and discharge prior to the incident.  She states she has has not had recent sexual activity and has a mild headache.       OB History     Gravida  4   Para  1   Term  1   Preterm      AB  1   Living  1      SAB  1   IAB      Ectopic      Multiple      Live Births  1           Past Medical History:  Diagnosis Date   Medical history non-contributory     Past Surgical History:  Procedure Laterality Date   CESAREAN SECTION     LAPAROSCOPIC GASTRIC SLEEVE RESECTION N/A 01/20/2018   Procedure: LAPAROSCOPIC GASTRIC SLEEVE RESECTION WITH UPPER ENDO AND ERAS PATHWAY;  Surgeon: Kinsinger, De Blanch, MD;  Location: [facility];  Service: General;  Laterality: N/A;    History reviewed. No pertinent family history.  Social History   Tobacco Use   Smoking status: Never   Smokeless tobacco: Never  Vaping Use   Vaping Use: Never used  Substance Use Topics   Alcohol use: Not Currently    Comment: socially   Drug use: Never    Allergies: No Known Allergies  Medications Prior to Admission  Medication Sig Dispense Refill Last Dose   Multiple Vitamin (MULTIVITAMIN WITH MINERALS) TABS tablet Take 1 tablet by mouth daily.   01/06/2021    Review of Systems  Constitutional:  Negative for chills and fever.  Eyes:  Negative for visual disturbance.  Genitourinary:  Negative for difficulty urinating, dysuria, vaginal bleeding and vaginal discharge.  Neurological:  Positive for  headaches (3/10). Negative for dizziness and light-headedness.  Physical Exam   Blood pressure 104/63, pulse 70, temperature 98.6 F (37 C), temperature source Oral, resp. rate 18, height 5\' 6"  (1.676 m), weight 91.6 kg, SpO2 100 %, not currently breastfeeding.  Physical Exam Constitutional:      Appearance: Normal appearance.  HENT:     Head: Normocephalic and atraumatic.  Eyes:     Conjunctiva/sclera: Conjunctivae normal.  Cardiovascular:     Rate and Rhythm: Normal rate.     Heart sounds: Normal heart sounds.  Pulmonary:     Effort: Pulmonary effort is normal. No respiratory distress.     Breath sounds: Normal breath sounds.  Abdominal:     General: Bowel sounds are normal.     Palpations: Abdomen is soft.     Tenderness: There is no abdominal tenderness.  Musculoskeletal:        General: Normal range of motion.     Cervical back: Normal range of motion.  Skin:    General: Skin is warm and dry.  Neurological:     Mental Status: She is alert and oriented to person, place, and time.  Psychiatric:  Mood and Affect: Mood normal.        Behavior: Behavior normal.        Thought Content: Thought content normal.    MAU Course  Procedures Results for orders placed or performed during the hospital encounter of 01/07/21 (from the past 24 hour(s))  I-Stat Beta hCG blood, ED ([facility], [facility], [facility] only)     Status: Abnormal   Collection Time: 01/07/21  1:55 AM  Result Value Ref Range   I-stat hCG, quantitative >2,000.0 (H) <5 mIU/mL   Comment 3          Wet prep, genital     Status: Abnormal   Collection Time: 01/07/21  4:07 AM   Specimen: PATH Cytology Cervicovaginal Ancillary Only  Result Value Ref Range   Yeast Wet Prep HPF POC NONE SEEN NONE SEEN   Trich, Wet Prep NONE SEEN NONE SEEN   Clue Cells Wet Prep HPF POC PRESENT (A) NONE SEEN   WBC, Wet Prep HPF POC NONE SEEN NONE SEEN   Sperm NONE SEEN   CBC     Status: Abnormal   Collection Time: 01/07/21  4:44 AM  Result Value  Ref Range   WBC 6.8 4.0 - 10.5 K/uL   RBC 3.62 (L) 3.87 - 5.11 MIL/uL   Hemoglobin 10.9 (L) 12.0 - 15.0 g/dL   HCT 15.4 (L) 00.8 - 67.6 %   MCV 92.0 80.0 - 100.0 fL   MCH 30.1 26.0 - 34.0 pg   MCHC 32.7 30.0 - 36.0 g/dL   RDW 19.5 09.3 - 26.7 %   Platelets 227 150 - 400 K/uL   nRBC 0.0 0.0 - 0.2 %  hCG, quantitative, pregnancy     Status: Abnormal   Collection Time: 01/07/21  4:44 AM  Result Value Ref Range   hCG, Beta Chain, Quant, S 17,129 (H) <5 mIU/mL   [place] MFM OB LIMITED  Result Date: 01/07/2021 ----------------------------------------------------------------------  OBSTETRICS REPORT                        (Signed Final 01/07/2021 08:04 am) ---------------------------------------------------------------------- Patient Info  ID #:       [id number]                          D.O.B.:  08/14/1985 (34 yrs)  Name:       Kimberly Dennis               Visit Date: 01/07/2021 05:14 am ---------------------------------------------------------------------- Performed By  Attending:        Ma Rings MD         Referred By:       [facility] MAU/Triage  Performed By:     Hurman Horn          [facility] ---------------------------------------------------------------------- Orders  #  Description                           Code        Ordered By  1  [place] MFM OB LIMITED                     [id number]    Hiroto Saltzman ----------------------------------------------------------------------  #  Order #                     Accession #                Episode #  1  [id number]                   [id number]                 [id number] ---------------------------------------------------------------------- Indications  Bleeding in early pregnancy                     O20.9  [redacted] weeks gestation of pregnancy                 Z3A.14  Previous cesarean delivery, antepartum          O34.219  ---------------------------------------------------------------------- Fetal Evaluation  Num Of Fetuses:          1  Fetal Heart Rate(bpm):   155  Cardiac Activity:        Observed  Presentation:            Breech  Placenta:                Posterior  Amniotic Fluid  AFI FV:      Within normal limits                              Largest Pocket(cm)                              3.7  Comment:    (3.2x 1.8x 4.1cm) SCH noted along inferior edge of placenta. ---------------------------------------------------------------------- OB History  Gravidity:    4         Term:   1         SAB:   2 ---------------------------------------------------------------------- Gestational Age  Clinical EDD:  14w 3d                                        EDD:   07/05/21  Best:          14w 3d     Det. By:  Clinical EDD             EDD:   07/05/21 ---------------------------------------------------------------------- Cervix Uterus Adnexa  Uterus  No abnormality visualized.  Right Ovary  Within normal limits.  Left Ovary  Within normal limits.  Adnexa  No abnormality visualized. ---------------------------------------------------------------------- Comments  This patient presented to the MAU due to vaginal bleeding.  A viable singleton intrauterine pregnancy was noted on  today's exam.  A limited ultrasound performed today shows that the fetus is  in the breech presentation.  There was normal amniotic fluid noted.  A 3 to 4 cm subchorionic hematoma was noted on the inferior  edge of the posterior placenta. ----------------------------------------------------------------------                   Ma Rings, MD Electronically Signed Final Report   01/07/2021 08:04 am ----------------------------------------------------------------------   MDM Physical Exam Wet Prep and GC/CT  Labs: CBC, hCG Ultrasound Assessment and Plan  35 year old, G4P1011  SIUP at 14.3 weeks [facility]  -Orders placed while patient in triage. -Cultures collected by  self-swab -Results as above -Informed of IUP c/w days and [facility]. -Educated on [facility], what to expect including bleeding, risks for miscarriage, and resolution.  -Further informed of recommendation to abstain from sexual activity for at least 72 hours after resolution of each bleeding incident.  -Informed of clue cells noted on wet prep.  However, will not treat today as no complaints of discharge or cramping. -Patient agreeable. -Declines medication for c/o HA.  -No other questions or concerns. -Encouraged to call primary office or return to MAU if symptoms worsen or with the onset of new symptoms. -Discharged to home in stable condition.  Cherre Robins 01/07/2021, 5:41 AM

## 2021-01-07 NOTE — ED Triage Notes (Signed)
Pt states she is [redacted] weeks pregnant and had vaginal bleeding tonight. Pt has had back pain and nausea. This is patient's third pregnancy, pt has one living child, pt miscarried other previously. Pt denies injury.

## 2021-01-09 LAB — GC/CHLAMYDIA PROBE AMP (~~LOC~~) NOT AT ARMC
Chlamydia: NEGATIVE
Comment: NEGATIVE
Comment: NORMAL
Neisseria Gonorrhea: NEGATIVE

## 2021-06-08 LAB — OB RESULTS CONSOLE GBS: GBS: NEGATIVE

## 2021-06-11 NOTE — H&P (Signed)
HPI: 36 y.o. G4P1011 @ [redacted]w[redacted]d estimated gestational age (as dated by LMP c/w 8 week ultrasound) presents for repeat cesarean section and bilateral salpingectomy for history of CS x 1 (desires repeat) and desire for permanent sterilization.  Leakage of fluid:  No Vaginal bleeding:  No Contractions:  No Fetal movement:  Yes  Prenatal care has been provided by Dr. Wellington Hampshire. Zakk Borgen [facility])  ROS:  Denies fevers, chills, chest pain, visual changes, SOB, RUQ/epigastric pain, N/V, dysuria, hematuria, or sudden onset/worsening bilateral LE or facial edema.  Pregnancy complicated by: History of CS x 1 (performed in 2009 for arrest of dilation) Thyromegaly/Hypothyroidism (normal TSH each trimester) Obesity (BMI 32) AMA (36) History of gastric sleeve (2019, Bariatric Surgeon: Dr. Gurney Maxin [facility]) Total weight loss: 149lbs) Resolved low-lying placenta Desires permanent sterilization  Prenatal Transfer Tool  Maternal Diabetes: No Genetic Screening: Declined Maternal Ultrasounds/Referrals: Normal Fetal Ultrasounds or other Referrals:  None Maternal Substance Abuse:  No Significant Maternal Medications:  None Significant Maternal Lab Results: Group B Strep negative   Prenatal Labs Blood type:  A Positive Antibody screen:  Negative CBC:  H/H 9.8/29.7 Rubella: Immune RPR:  Non-reactive Hep B:  Negative Hep C:  Negative HIV:  Negative GC/CT:  Negative Glucola:  deferred due to history of bariatric surgery HgbA1C: 4.4 (low)  Immunizations: Tdap: Given prenatally Flu: Declines  OBHx:  OB History     Gravida  4   Para  1   Term  1   Preterm      AB  1   Living  1      SAB  1   IAB      Ectopic      Multiple      Live Births  1          PMHx:  See above Meds:  PNV Allergy:  No Known Allergies SurgHx: History of CS x 1 SocHx:   Denies Tobacco, ETOH, illicit drugs  O: LMP  (LMP Unknown)  Gen. AAOx3, NAD CV.  RRR  Resp.  CTAB, no wheezes/rales/rhonchi Abd. Gravid, soft, non-tender throughout, no rebound/guarding Extr.  No bilateral LE edema, no calf tenderness bilaterally   Last [place]:  06/08/2021 [redacted]w[redacted]d EFW 2896g 6 lbs  6 oz (0000000), AAFV, Cephalic, posterior placenta  Labs: see orders  A/P:  36 y.o. [id number] @ [redacted]w[redacted]d who presents for repeat cesarean section and bilateral salpingectomy.  - Admit to L&D - Admit labs (CBC, T&S, COVID screen) - CEFM/Toco - Diet:  NPO - IVF:  Per routine - VTE Prophylaxis:  SCDs - GBS Status:  Negative - Presentation:  Cephalic on last [place] - Antibiotics:  Ancef 2g on call to OR - Shohl's on call to OR - Consents signed and witnessed  Consents: I have explained to the patient that this surgery is performed to deliver their baby or babies through an incision in the abdomen and incision in the uterus.  Prior to surgery, the risks and benefits of the surgery, as well as alternative treatments were discussed.  The risks include, but are not limited to, possible need for cesarean delivery for all future pregnancies, bleeding at the time of surgery that could necessitate a blood transfusion and/or hysterectomy, rupture of the uterus during a future pregnancy that could cause a preterm delivery and/or requiring hysterectomy, infection, damage to surrounding organs and tissues, damage to bladder, damage to ureters, causing kidney damage, and requiring additional procedures, damage to bowels, resulting in further surgery,  postoperative pain, short-term and long-term, scarring on the abdominal wall and intra-abdominally, need for further surgery, development of an incisional hernia, deep vein thrombosis and/or pulmonary embolism, wound infection and/or separation, painful intercourse, urinary leakage, impact on future pregnancies including but not limited to, abnormal location or attachment of the placenta to the uterus, such as placenta previa or accreta, that may necessitate a blood transfusion  and/or hysterectomy, impact on total family size, complications the course of which cannot be predicted or prevented, and death. Patient was consented for blood products.  The patient is aware that bleeding may result in the need for a blood transfusion which includes risk of transmission of HIV (1:2 million), Hepatitis C (1:2 million), and Hepatitis B (1:200 thousand) and transfusion reaction.  Patient voiced understanding of the above risks as well as understanding of indications for blood transfusion.   Drema Dallas, DO [phone] (office)

## 2021-06-15 NOTE — Patient Instructions (Signed)
Camerin Blansett ? 06/15/2021 ? ? Your procedure is scheduled on:  06/30/2021 ? Arrive at 1030 at [employer] on [address] at [facility]  and [employer]. You are invited to use the FREE valet parking or use the Visitor's parking deck. ? Pick up the phone at the desk and dial [phone]. ? Call this number if you have problems the morning of surgery: [phone] ? Remember: ? ? Do not eat food:(After Midnight) Desp?s de medianoche. ? Do not drink clear liquids: (After Midnight) Desp?s de medianoche. ? Take these medicines the morning of surgery with A SIP OF WATER:  none ? ? Do not wear jewelry, make-up or nail polish. ? Do not wear lotions, powders, or perfumes. Do not wear deodorant. ? Do not shave 48 hours prior to surgery. ? Do not bring valuables to the hospital.  [facility] is not  ? responsible for any belongings or valuables brought to the hospital. ? Contacts, dentures or bridgework may not be worn into surgery. ? Leave suitcase in the car. After surgery it may be brought to your room. ? For patients admitted to the hospital, checkout time is 11:00 AM the day of  ?            discharge. ? ?   ? Please read over the following fact sheets that you were given:  ?   Preparing for Surgery ? ? ?

## 2021-06-16 ENCOUNTER — Telehealth (HOSPITAL_COMMUNITY): Payer: Self-pay | Admitting: *Deleted

## 2021-06-16 NOTE — Telephone Encounter (Signed)
Preadmission screen  

## 2021-06-19 ENCOUNTER — Encounter (HOSPITAL_COMMUNITY): Payer: Self-pay

## 2021-06-27 ENCOUNTER — Encounter (HOSPITAL_COMMUNITY): Payer: Self-pay | Admitting: Obstetrics and Gynecology

## 2021-06-28 ENCOUNTER — Other Ambulatory Visit: Payer: Self-pay | Admitting: Obstetrics and Gynecology

## 2021-06-28 ENCOUNTER — Other Ambulatory Visit: Payer: Self-pay

## 2021-06-28 ENCOUNTER — Other Ambulatory Visit (HOSPITAL_COMMUNITY)
Admission: RE | Admit: 2021-06-28 | Discharge: 2021-06-28 | Disposition: A | Discharge status: Home / Self Care | Payer: 59 | Source: Ambulatory Visit | Attending: Obstetrics and Gynecology | Admitting: Obstetrics and Gynecology

## 2021-06-28 DIAGNOSIS — Z01812 Encounter for preprocedural laboratory examination: Secondary | ICD-10-CM | POA: Insufficient documentation

## 2021-06-28 DIAGNOSIS — Z20822 Contact with and (suspected) exposure to covid-19: Secondary | ICD-10-CM | POA: Insufficient documentation

## 2021-06-28 LAB — TYPE AND SCREEN
ABO/RH(D): A POS
Antibody Screen: NEGATIVE

## 2021-06-28 LAB — CBC
HCT: 31.3 % — ABNORMAL LOW (ref 36.0–46.0)
Hemoglobin: 9.4 g/dL — ABNORMAL LOW (ref 12.0–15.0)
MCH: 25.4 pg — ABNORMAL LOW (ref 26.0–34.0)
MCHC: 30 g/dL (ref 30.0–36.0)
MCV: 84.6 fL (ref 80.0–100.0)
Platelets: 231 10*3/uL (ref 150–400)
RBC: 3.7 MIL/uL — ABNORMAL LOW (ref 3.87–5.11)
RDW: 17.5 % — ABNORMAL HIGH (ref 11.5–15.5)
WBC: 6.6 10*3/uL (ref 4.0–10.5)
nRBC: 0 % (ref 0.0–0.2)

## 2021-06-28 LAB — RPR: RPR Ser Ql: NONREACTIVE

## 2021-06-28 LAB — SARS CORONAVIRUS 2 (TAT 6-24 HRS): SARS Coronavirus 2: NEGATIVE

## 2021-06-30 ENCOUNTER — Other Ambulatory Visit: Payer: Self-pay

## 2021-06-30 ENCOUNTER — Inpatient Hospital Stay (HOSPITAL_COMMUNITY)
Admission: RE | Admit: 2021-06-30 | Discharge: 2021-07-02 | DRG: 785 | Disposition: A | Discharge status: Home / Self Care | Payer: 59 | Attending: Obstetrics and Gynecology | Admitting: Obstetrics and Gynecology

## 2021-06-30 ENCOUNTER — Inpatient Hospital Stay (HOSPITAL_COMMUNITY): Payer: 59 | Admitting: Certified Registered Nurse Anesthetist

## 2021-06-30 ENCOUNTER — Encounter (HOSPITAL_COMMUNITY): Payer: Self-pay | Admitting: Obstetrics and Gynecology

## 2021-06-30 ENCOUNTER — Encounter (HOSPITAL_COMMUNITY)
Admission: RE | Disposition: A | Discharge status: Home / Self Care | Payer: Self-pay | Source: Home / Self Care | Attending: Obstetrics and Gynecology

## 2021-06-30 DIAGNOSIS — Z302 Encounter for sterilization: Secondary | ICD-10-CM

## 2021-06-30 DIAGNOSIS — O99844 Bariatric surgery status complicating childbirth: Secondary | ICD-10-CM | POA: Diagnosis present

## 2021-06-30 DIAGNOSIS — Z3A39 39 weeks gestation of pregnancy: Secondary | ICD-10-CM | POA: Diagnosis not present

## 2021-06-30 DIAGNOSIS — O34211 Maternal care for low transverse scar from previous cesarean delivery: Secondary | ICD-10-CM | POA: Diagnosis present

## 2021-06-30 DIAGNOSIS — O99214 Obesity complicating childbirth: Secondary | ICD-10-CM | POA: Diagnosis present

## 2021-06-30 DIAGNOSIS — Z98891 History of uterine scar from previous surgery: Secondary | ICD-10-CM

## 2021-06-30 HISTORY — DX: Anemia, unspecified: D64.9

## 2021-06-30 HISTORY — DX: Papillomavirus as the cause of diseases classified elsewhere: B97.7

## 2021-06-30 HISTORY — DX: Iodine-deficiency related diffuse (endemic) goiter: E01.0

## 2021-06-30 HISTORY — DX: Obesity, unspecified: E66.9

## 2021-06-30 HISTORY — DX: Hypothyroidism, unspecified: E03.9

## 2021-06-30 HISTORY — DX: Unspecified abnormal cytological findings in specimens from vagina: R87.629

## 2021-06-30 SURGERY — Surgical Case
Anesthesia: Spinal

## 2021-06-30 MED ORDER — MORPHINE SULFATE (PF) 0.5 MG/ML IJ SOLN
INTRAMUSCULAR | Status: AC
  Filled 2021-06-30: qty 10

## 2021-06-30 MED ORDER — MORPHINE SULFATE (PF) 0.5 MG/ML IJ SOLN
INTRAMUSCULAR | Status: DC | PRN
  Administered 2021-06-30: 150 ug via INTRATHECAL

## 2021-06-30 MED ORDER — DIPHENHYDRAMINE HCL 50 MG/ML IJ SOLN: 12.5000 mg | INTRAMUSCULAR | Status: DC | PRN

## 2021-06-30 MED ORDER — KETOROLAC TROMETHAMINE 30 MG/ML IJ SOLN
INTRAMUSCULAR | Status: AC
  Filled 2021-06-30: qty 1

## 2021-06-30 MED ORDER — OXYCODONE HCL 5 MG PO TABS
5.0000 mg | ORAL_TABLET | ORAL | Status: DC | PRN
  Administered 2021-07-01 (×2): 5 mg via ORAL
  Administered 2021-07-01 (×3): 10 mg via ORAL
  Administered 2021-07-02: 5 mg via ORAL
  Filled 2021-06-30 (×2): qty 1
  Filled 2021-06-30: qty 2
  Filled 2021-06-30: qty 1
  Filled 2021-06-30 (×2): qty 2

## 2021-06-30 MED ORDER — NALOXONE HCL 0.4 MG/ML IJ SOLN: 0.4000 mg | INTRAMUSCULAR | Status: DC | PRN

## 2021-06-30 MED ORDER — STERILE WATER FOR IRRIGATION IR SOLN
Status: DC | PRN
  Administered 2021-06-30: 1000 mL

## 2021-06-30 MED ORDER — MEPERIDINE HCL 25 MG/ML IJ SOLN: 6.2500 mg | INTRAMUSCULAR | Status: DC | PRN

## 2021-06-30 MED ORDER — ACETAMINOPHEN 10 MG/ML IV SOLN
1000.0000 mg | Freq: Four times a day (QID) | INTRAVENOUS | Status: DC
  Administered 2021-06-30: 1000 mg via INTRAVENOUS
  Filled 2021-06-30: qty 100

## 2021-06-30 MED ORDER — SCOPOLAMINE 1 MG/3DAYS TD PT72
1.0000 | MEDICATED_PATCH | Freq: Once | TRANSDERMAL | Status: DC
  Administered 2021-06-30: 1.5 mg via TRANSDERMAL

## 2021-06-30 MED ORDER — WITCH HAZEL-GLYCERIN EX PADS: 1.0000 "application " | MEDICATED_PAD | CUTANEOUS | Status: DC | PRN

## 2021-06-30 MED ORDER — OXYTOCIN-SODIUM CHLORIDE 30-0.9 UT/500ML-% IV SOLN
INTRAVENOUS | Status: AC
Start: 2021-06-30 — End: ?
  Filled 2021-06-30: qty 500

## 2021-06-30 MED ORDER — ZOLPIDEM TARTRATE 5 MG PO TABS: 5.0000 mg | ORAL_TABLET | Freq: Every evening | ORAL | Status: DC | PRN

## 2021-06-30 MED ORDER — NALOXONE HCL 4 MG/10ML IJ SOLN
1.0000 ug/kg/h | INTRAVENOUS | Status: DC | PRN
  Filled 2021-06-30: qty 5

## 2021-06-30 MED ORDER — CEFAZOLIN SODIUM-DEXTROSE 2-4 GM/100ML-% IV SOLN
2.0000 g | INTRAVENOUS | Status: AC
  Administered 2021-06-30: 2 g via INTRAVENOUS

## 2021-06-30 MED ORDER — HYDROMORPHONE HCL 1 MG/ML IJ SOLN
INTRAMUSCULAR | Status: AC
  Filled 2021-06-30: qty 0.5

## 2021-06-30 MED ORDER — HYDROMORPHONE HCL 1 MG/ML IJ SOLN
0.2500 mg | INTRAMUSCULAR | Status: DC | PRN
  Administered 2021-06-30 (×2): 0.5 mg via INTRAVENOUS

## 2021-06-30 MED ORDER — SOD CITRATE-CITRIC ACID 500-334 MG/5ML PO SOLN
30.0000 mL | ORAL | Status: AC
  Administered 2021-06-30: 30 mL via ORAL

## 2021-06-30 MED ORDER — ACETAMINOPHEN 500 MG PO TABS
1000.0000 mg | ORAL_TABLET | Freq: Four times a day (QID) | ORAL | Status: DC
  Administered 2021-06-30 – 2021-07-02 (×7): 1000 mg via ORAL
  Filled 2021-06-30 (×7): qty 2

## 2021-06-30 MED ORDER — OXYTOCIN-SODIUM CHLORIDE 30-0.9 UT/500ML-% IV SOLN
INTRAVENOUS | Status: AC
  Filled 2021-06-30: qty 500

## 2021-06-30 MED ORDER — SODIUM CHLORIDE 0.9% FLUSH: 3.0000 mL | INTRAVENOUS | Status: DC | PRN

## 2021-06-30 MED ORDER — ONDANSETRON HCL 4 MG/2ML IJ SOLN
INTRAMUSCULAR | Status: DC | PRN
  Administered 2021-06-30: 4 mg via INTRAVENOUS

## 2021-06-30 MED ORDER — IBUPROFEN 600 MG PO TABS
600.0000 mg | ORAL_TABLET | Freq: Four times a day (QID) | ORAL | Status: DC
  Administered 2021-07-01 – 2021-07-02 (×4): 600 mg via ORAL
  Filled 2021-06-30 (×4): qty 1

## 2021-06-30 MED ORDER — KETOROLAC TROMETHAMINE 30 MG/ML IJ SOLN
30.0000 mg | Freq: Four times a day (QID) | INTRAMUSCULAR | Status: AC
  Administered 2021-06-30 – 2021-07-01 (×4): 30 mg via INTRAVENOUS
  Filled 2021-06-30 (×3): qty 1

## 2021-06-30 MED ORDER — SENNOSIDES-DOCUSATE SODIUM 8.6-50 MG PO TABS
2.0000 | ORAL_TABLET | Freq: Every day | ORAL | Status: DC
  Administered 2021-07-01 – 2021-07-02 (×2): 2 via ORAL
  Filled 2021-06-30 (×2): qty 2

## 2021-06-30 MED ORDER — PHENYLEPHRINE HCL-NACL 20-0.9 MG/250ML-% IV SOLN
INTRAVENOUS | Status: DC | PRN
  Administered 2021-06-30: 60 ug/min via INTRAVENOUS

## 2021-06-30 MED ORDER — BUPIVACAINE IN DEXTROSE 0.75-8.25 % IT SOLN
INTRATHECAL | Status: DC | PRN
  Administered 2021-06-30: 1.6 mL via INTRATHECAL

## 2021-06-30 MED ORDER — PRENATAL 27-0.8 MG PO TABS: 1.0000 | ORAL_TABLET | Freq: Every day | ORAL | Status: DC

## 2021-06-30 MED ORDER — ONDANSETRON HCL 4 MG/2ML IJ SOLN: 4.0000 mg | Freq: Three times a day (TID) | INTRAMUSCULAR | Status: DC | PRN

## 2021-06-30 MED ORDER — FENTANYL CITRATE (PF) 100 MCG/2ML IJ SOLN
INTRAMUSCULAR | Status: DC | PRN
  Administered 2021-06-30: 15 ug via INTRATHECAL

## 2021-06-30 MED ORDER — COCONUT OIL OIL: 1.0000 "application " | TOPICAL_OIL | Status: DC | PRN

## 2021-06-30 MED ORDER — ACETAMINOPHEN 500 MG PO TABS: 1000.0000 mg | ORAL_TABLET | Freq: Four times a day (QID) | ORAL | Status: DC

## 2021-06-30 MED ORDER — DIBUCAINE (PERIANAL) 1 % EX OINT
1.0000 "application " | TOPICAL_OINTMENT | CUTANEOUS | Status: DC | PRN
  Filled 2021-06-30: qty 28

## 2021-06-30 MED ORDER — OXYTOCIN-SODIUM CHLORIDE 30-0.9 UT/500ML-% IV SOLN
INTRAVENOUS | Status: DC | PRN
  Administered 2021-06-30: 30 [IU] via INTRAVENOUS

## 2021-06-30 MED ORDER — PROMETHAZINE HCL 25 MG/ML IJ SOLN: 6.2500 mg | INTRAMUSCULAR | Status: DC | PRN

## 2021-06-30 MED ORDER — DIPHENHYDRAMINE HCL 25 MG PO CAPS: 25.0000 mg | ORAL_CAPSULE | Freq: Four times a day (QID) | ORAL | Status: DC | PRN

## 2021-06-30 MED ORDER — FERROUS SULFATE 325 (65 FE) MG PO TABS
325.0000 mg | ORAL_TABLET | Freq: Every day | ORAL | Status: DC
  Administered 2021-07-01 – 2021-07-02 (×2): 325 mg via ORAL
  Filled 2021-06-30 (×2): qty 1

## 2021-06-30 MED ORDER — DEXAMETHASONE SODIUM PHOSPHATE 10 MG/ML IJ SOLN
INTRAMUSCULAR | Status: DC | PRN
Start: 2021-06-30 — End: 2021-06-30
  Administered 2021-06-30: 10 mg via INTRAVENOUS

## 2021-06-30 MED ORDER — SOD CITRATE-CITRIC ACID 500-334 MG/5ML PO SOLN
ORAL | Status: AC
  Filled 2021-06-30: qty 30

## 2021-06-30 MED ORDER — CEFAZOLIN SODIUM-DEXTROSE 2-4 GM/100ML-% IV SOLN
INTRAVENOUS | Status: AC
  Filled 2021-06-30: qty 100

## 2021-06-30 MED ORDER — OXYTOCIN-SODIUM CHLORIDE 30-0.9 UT/500ML-% IV SOLN: 2.5000 [IU]/h | INTRAVENOUS | Status: AC

## 2021-06-30 MED ORDER — ACETAMINOPHEN 10 MG/ML IV SOLN
INTRAVENOUS | Status: AC
  Filled 2021-06-30: qty 100

## 2021-06-30 MED ORDER — SODIUM CHLORIDE 0.9 % IR SOLN
Status: DC | PRN
  Administered 2021-06-30: 800 mL

## 2021-06-30 MED ORDER — DEXAMETHASONE SODIUM PHOSPHATE 10 MG/ML IJ SOLN
INTRAMUSCULAR | Status: AC
  Filled 2021-06-30: qty 1

## 2021-06-30 MED ORDER — SCOPOLAMINE 1 MG/3DAYS TD PT72
MEDICATED_PATCH | TRANSDERMAL | Status: AC
  Filled 2021-06-30: qty 1

## 2021-06-30 MED ORDER — LACTATED RINGERS IV SOLN: INTRAVENOUS | Status: DC

## 2021-06-30 MED ORDER — PHENYLEPHRINE 40 MCG/ML (10ML) SYRINGE FOR IV PUSH (FOR BLOOD PRESSURE SUPPORT)
PREFILLED_SYRINGE | INTRAVENOUS | Status: DC | PRN
  Administered 2021-06-30: 80 ug via INTRAVENOUS

## 2021-06-30 MED ORDER — SIMETHICONE 80 MG PO CHEW
80.0000 mg | CHEWABLE_TABLET | Freq: Three times a day (TID) | ORAL | Status: DC
  Administered 2021-07-01 – 2021-07-02 (×5): 80 mg via ORAL
  Filled 2021-06-30 (×5): qty 1

## 2021-06-30 MED ORDER — FENTANYL CITRATE (PF) 100 MCG/2ML IJ SOLN
INTRAMUSCULAR | Status: AC
  Filled 2021-06-30: qty 2

## 2021-06-30 MED ORDER — PRENATAL MULTIVITAMIN CH
1.0000 | ORAL_TABLET | Freq: Every day | ORAL | Status: DC
  Administered 2021-07-01 – 2021-07-02 (×2): 1 via ORAL
  Filled 2021-06-30 (×2): qty 1

## 2021-06-30 MED ORDER — MORPHINE SULFATE (PF) 2 MG/ML IV SOLN: 1.0000 mg | INTRAVENOUS | Status: DC | PRN

## 2021-06-30 MED ORDER — MENTHOL 3 MG MT LOZG: 1.0000 | LOZENGE | OROMUCOSAL | Status: DC | PRN

## 2021-06-30 MED ORDER — DIPHENHYDRAMINE HCL 25 MG PO CAPS: 25.0000 mg | ORAL_CAPSULE | ORAL | Status: DC | PRN

## 2021-06-30 MED ORDER — ONDANSETRON HCL 4 MG/2ML IJ SOLN
INTRAMUSCULAR | Status: AC
  Filled 2021-06-30: qty 2

## 2021-06-30 MED ORDER — PHENYLEPHRINE HCL-NACL 20-0.9 MG/250ML-% IV SOLN
INTRAVENOUS | Status: AC
  Filled 2021-06-30: qty 250

## 2021-06-30 MED ORDER — SIMETHICONE 80 MG PO CHEW: 80.0000 mg | CHEWABLE_TABLET | ORAL | Status: DC | PRN

## 2021-06-30 SURGICAL SUPPLY — 43 items
BENZOIN TINCTURE PRP APPL 2/3 (GAUZE/BANDAGES/DRESSINGS) ×2 IMPLANT
CHLORAPREP W/TINT 26 (MISCELLANEOUS) ×3 IMPLANT
CLAMP CORD UMBIL (MISCELLANEOUS) ×1 IMPLANT
CLOTH BEACON ORANGE TIMEOUT ST (SAFETY) ×2 IMPLANT
CLSR STERI-STRIP ANTIMIC 1/2X4 (GAUZE/BANDAGES/DRESSINGS) ×1 IMPLANT
DRAPE C SECTION CLR SCREEN (DRAPES) ×2 IMPLANT
DRSG OPSITE POSTOP 4X10 (GAUZE/BANDAGES/DRESSINGS) ×2 IMPLANT
ELECT REM PT RETURN 9FT ADLT (ELECTROSURGICAL) ×2
ELECTRODE REM PT RTRN 9FT ADLT (ELECTROSURGICAL) ×1 IMPLANT
EXTRACTOR VACUUM KIWI (MISCELLANEOUS) IMPLANT
GAUZE SPONGE 4X4 12PLY STRL (GAUZE/BANDAGES/DRESSINGS) ×2 IMPLANT
GAUZE SPONGE 4X4 12PLY STRL LF (GAUZE/BANDAGES/DRESSINGS) ×2 IMPLANT
GLOVE BIOGEL PI IND STRL 7.0 (GLOVE) ×2 IMPLANT
GLOVE BIOGEL PI INDICATOR 7.0 (GLOVE) ×2
GLOVE SURG ENC MOIS LTX SZ6.5 (GLOVE) ×2 IMPLANT
GLOVE SURG POLYISO LF SZ6.5 (GLOVE) ×2 IMPLANT
GOWN STRL REUS W/ TWL LRG LVL3 (GOWN DISPOSABLE) ×3 IMPLANT
GOWN STRL REUS W/TWL LRG LVL3 (GOWN DISPOSABLE) ×3
KIT ABG SYR 3ML LUER SLIP (SYRINGE) IMPLANT
LIGASURE IMPACT 36 18CM CVD LR (INSTRUMENTS) ×1 IMPLANT
MAT PREVALON FULL STRYKER (MISCELLANEOUS) ×1 IMPLANT
NDL HYPO 25X5/8 SAFETYGLIDE (NEEDLE) IMPLANT
NEEDLE HYPO 25X5/8 SAFETYGLIDE (NEEDLE) IMPLANT
NS IRRIG 1000ML POUR BTL (IV SOLUTION) ×2 IMPLANT
PACK C SECTION WH (CUSTOM PROCEDURE TRAY) ×2 IMPLANT
PAD ABD 7.5X8 STRL (GAUZE/BANDAGES/DRESSINGS) ×1 IMPLANT
PAD ABD 8X10 STRL (GAUZE/BANDAGES/DRESSINGS) ×1 IMPLANT
PAD OB MATERNITY 4.3X12.25 (PERSONAL CARE ITEMS) ×2 IMPLANT
RTRCTR C-SECT PINK 25CM LRG (MISCELLANEOUS) ×2 IMPLANT
STRIP CLOSURE SKIN 1/2X4 (GAUZE/BANDAGES/DRESSINGS) ×2 IMPLANT
SUT MNCRL 0 VIOLET CTX 36 (SUTURE) ×2 IMPLANT
SUT MNCRL+ AB 3-0 CT1 36 (SUTURE) ×2 IMPLANT
SUT MONOCRYL 0 CTX 36 (SUTURE) ×2
SUT MONOCRYL AB 3-0 CT1 36IN (SUTURE) ×2
SUT PDS AB 0 CTX 36 PDP370T (SUTURE) ×4 IMPLANT
SUT PLAIN 0 NONE (SUTURE) IMPLANT
SUT VIC AB 2-0 CT1 27 (SUTURE)
SUT VIC AB 2-0 CT1 TAPERPNT 27 (SUTURE) IMPLANT
SUT VIC AB 4-0 KS 27 (SUTURE) ×2 IMPLANT
TAPE CLOTH SURG 4X10 WHT LF (GAUZE/BANDAGES/DRESSINGS) ×1 IMPLANT
TOWEL OR 17X24 6PK STRL BLUE (TOWEL DISPOSABLE) ×4 IMPLANT
TRAY FOLEY W/BAG SLVR 14FR LF (SET/KITS/TRAYS/PACK) ×2 IMPLANT
WATER STERILE IRR 1000ML POUR (IV SOLUTION) ×2 IMPLANT

## 2021-06-30 NOTE — Anesthesia Preprocedure Evaluation (Addendum)
Anesthesia Evaluation  ?Patient identified by MRN, date of birth, ID band ?Patient awake ? ? ? ?Reviewed: ?Allergy & Precautions, NPO status , Patient's Chart, lab work & pertinent test results ? ?Airway ?Mallampati: II ? ?TM Distance: >3 FB ?Neck ROM: Full ? ? ? Dental ? ?(+) Dental Advisory Given, Teeth Intact ?  ?Pulmonary ?neg pulmonary ROS,  ?  ?Pulmonary exam normal ?breath sounds clear to auscultation ? ? ? ? ? ? Cardiovascular ?negative cardio ROS ?Normal cardiovascular exam ?Rhythm:Regular Rate:Normal ? ? ?  ?Neuro/Psych ?negative neurological ROS ?   ? GI/Hepatic ?negative GI ROS, Neg liver ROS,   ?Endo/Other  ?Hypothyroidism  ? Renal/GU ?negative Renal ROS  ? ?  ?Musculoskeletal ?negative musculoskeletal ROS ?(+)  ? Abdominal ?(+) + obese,   ?Peds ? Hematology ? ?(+) Blood dyscrasia, anemia ,   ?Anesthesia Other Findings ? ? Reproductive/Obstetrics ?(+) Pregnancy ? ?  ? ? ? ? ? ? ? ? ? ? ? ? ? ?  ?  ? ? ? ? ? ? ? ?Anesthesia Physical ?Anesthesia Plan ? ?ASA: 3 ? ?Anesthesia Plan: Spinal  ? ?Post-op Pain Management: Ofirmev IV (intra-op)* and Toradol IV (intra-op)*  ? ?Induction:  ? ?PONV Risk Score and Plan: 4 or greater and Ondansetron, Dexamethasone, Scopolamine patch - Pre-op and Treatment may vary due to age or medical condition ? ?Airway Management Planned: Natural Airway ? ?Additional Equipment:  ? ?Intra-op Plan:  ? ?Post-operative Plan:  ? ?Informed Consent: I have reviewed the patients History and Physical, chart, labs and discussed the procedure including the risks, benefits and alternatives for the proposed anesthesia with the patient or authorized representative who has indicated his/her understanding and acceptance.  ? ? ? ?Dental advisory given ? ?Plan Discussed with: CRNA ? ?Anesthesia Plan Comments:   ? ? ? ? ? ?Anesthesia Quick Evaluation ? ?

## 2021-06-30 NOTE — Interval H&P Note (Signed)
History and Physical Interval Note: ? ?06/30/2021 ?12:22 PM ? ?Kimberly Dennis  has presented today for surgery, with the diagnosis of History of Cesarean Delivery.  The various methods of treatment have been discussed with the patient and family. After consideration of risks, benefits and other options for treatment, the patient has consented to  Procedure(s): ?CESAREAN SECTION WITH BILATERAL SALPINGECTOMY (N/A) ?APPLICATION OF CELL SAVER (N/A) as a surgical intervention.  The patient's history has been reviewed, patient examined, no change in status, stable for surgery.  I have reviewed the patient's chart and labs.  Questions were answered to the patient's satisfaction.   ? ? ?[name] ? ? ?

## 2021-06-30 NOTE — Anesthesia Procedure Notes (Signed)
Spinal ? ?Patient location during procedure: OR ?Start time: 06/30/2021 12:27 PM ?End time: 06/30/2021 12:33 PM ?Reason for block: surgical anesthesia ?Staffing ?Performed: anesthesiologist  ?Anesthesiologist: Nolon Nations, MD ?Preanesthetic Checklist ?Completed: patient identified, IV checked, site marked, risks and benefits discussed, surgical consent, monitors and equipment checked, pre-op evaluation and timeout performed ?Spinal Block ?Patient position: sitting ?Prep: DuraPrep and site prepped and draped ?Patient monitoring: heart rate, continuous pulse ox and blood pressure ?Approach: midline ?Location: L3-4 ?Injection technique: single-shot ?Needle ?Needle type: Spinocan  ?Needle gauge: 25 G ?Needle length: 9 cm ?Additional Notes ?Expiration date of kit checked and confirmed. Patient tolerated procedure well, without complications. ? ? ? ?

## 2021-06-30 NOTE — Transfer of Care (Signed)
Immediate Anesthesia Transfer of Care Note ? ?Patient: Kimberly Dennis ? ?Procedure(s) Performed: CESAREAN SECTION WITH BILATERAL SALPINGECTOMY ?APPLICATION OF CELL SAVER ? ?Patient Location: PACU ? ?Anesthesia Type:Spinal ? ?Level of Consciousness: awake, alert  and oriented ? ?Airway & Oxygen Therapy: Patient Spontanous Breathing ? ?Post-op Assessment: Report given to RN and Post -op Vital signs reviewed and stable ? ?Post vital signs: Reviewed and stable ? ?Last Vitals:  ?Vitals Value Taken Time  ?BP 103/63 06/30/21 1347  ?Temp    ?Pulse 63 06/30/21 1353  ?Resp 22 06/30/21 1353  ?SpO2 100 % 06/30/21 1353  ?Vitals shown include unvalidated device data. ? ?Last Pain:  ?Vitals:  ? 06/30/21 1103  ?TempSrc: Oral  ?   ? ?  ? ?Complications: No notable events documented. ?

## 2021-06-30 NOTE — Anesthesia Postprocedure Evaluation (Signed)
Anesthesia Post Note ? ?Patient: Kimberly Dennis ? ?Procedure(s) Performed: CESAREAN SECTION WITH BILATERAL SALPINGECTOMY ?APPLICATION OF CELL SAVER ? ?  ? ?Patient location during evaluation: PACU ?Anesthesia Type: Spinal ?Level of consciousness: awake and alert ?Pain management: pain level controlled ?Vital Signs Assessment: post-procedure vital signs reviewed and stable ?Respiratory status: spontaneous breathing and respiratory function stable ?Cardiovascular status: blood pressure returned to baseline and stable ?Postop Assessment: spinal receding ?Anesthetic complications: no ? ? ?No notable events documented. ? ?Last Vitals:  ?Vitals:  ? 06/30/21 1500 06/30/21 1520  ?BP: 107/70 112/70  ?Pulse: 61 (!) 58  ?Resp: 17 20  ?Temp:  36.8 ?C  ?SpO2: 100% 100%  ?  ?Last Pain:  ?Vitals:  ? 06/30/21 1500  ?TempSrc:   ?PainSc: 3   ? ? ?  ?  ?  ?  ?  ?  ? ?Lewie Loron ? ? ? ? ?

## 2021-06-30 NOTE — Lactation Note (Signed)
This note was copied from a baby's chart. ?Lactation Consultation Note ?Mom stated baby hasn't BF 9 hrs old. ?LC un-swaddled baby and woke him up. Changed a stool and he voided while changing diaper. ?Placed pillows for support and attempted to latch baby. Baby wouldn't open mouth hardly at first, then he would open a little more and mom and LC would feed nipple into mouth. Baby push it out. Repeated this w/o baby sucking or latching. ?Mom asked for formula. She plans to breast/formula feed baby. ?Newborn feeding habits reviewed. Baby is "juicy" LC used bulb syring several times to clear mouth. Baby tried to spit up several times but didn't. Explained that is why baby isn't really interested or hungry. ?Explained size of tummy. Mom still wanted formula. LC gave supplemental amount information sheet and slow flow nipples and bottle. LC wanted to show mom if she was going to give formula how to pace feed. Mom stated she wanted to keep trying to latch. LC reviewed pace feeding. ?Mom encouraged to feed baby 8-12 times/24 hours and with feeding cues.   ?Encouraged STS, I&O, pre-pumping if needed w/hand pump. ?Encouraged to call for assistance if needed. ? ?Patient Name: Kimberly Dennis ?Today's Date: 06/30/2021 ?Reason for consult: Initial assessment;Term;Maternal endocrine disorder ?Age:60 hours ? ?Maternal Data ?Does the patient have breastfeeding experience prior to this delivery?: Yes ?How long did the patient breastfeed?: 1 week ? ?Feeding ?  ? ?LATCH Score ?Latch: Too sleepy or reluctant, no latch achieved, no sucking elicited. ? ?Audible Swallowing: None ? ?Type of Nipple: Everted at rest and after stimulation ? ?Comfort (Breast/Nipple): Soft / non-tender ? ?Hold (Positioning): Assistance needed to correctly position infant at breast and maintain latch. ? ?LATCH Score: 5 ? ? ?Lactation Tools Discussed/Used ?  ? ?Interventions ?Interventions: Breast feeding basics reviewed;Assisted with latch;Breast  massage;Hand express;Pre-pump if needed;Breast compression;Adjust position;Support pillows;Position options;Hand pump ? ?Discharge ?  ? ?Consult Status ?Consult Status: Follow-up ?Date: 07/01/21 ?Follow-up type: In-patient ? ? ? ?Charyl Dancer ?06/30/2021, 10:03 PM ? ? ? ?

## 2021-06-30 NOTE — Op Note (Signed)
Pre Op Dx:   ?1. Single live IUP at [redacted]w[redacted]d ?2. History of cesarean section x 1 (desires repeat) ?3. Desires permanent sterilization ? ?Post Op Dx:  ?Same as pre-op ? ?Procedure:  Low Transverse Cesarean Section and Bilateral Salpingectomy ? ?Surgeon:  Dr. Wellington Hampshire. Delora Fuel ?Assistants:  Dr. Eula Flax  ?Anesthesia:  Spinal ? ?EBL:  382cc  ?IVF:  2200cc ?UOP:  50cc ? ?Drains:  Foley catheter  ?Specimen removed:  Placenta - sent to L&D, Bilateral fallopian tubes - sent to pathology ?Device(s) implanted:  None ?Case Type:  Clean-contaminated ?Findings: Normal-appearing uterus, bilateral fallopian tubes, and ovaries. No pelvic adhesive disease. Fetus in cephalic position. Clear amniotic fluid. APGARS 9/9. ?Complications: None ? ?Indications:  36 y.o. [id number] at [redacted]w[redacted]d with history of cesarean section who desired repeat and desires permanent sterilization. ? ?Procedure:  After informed consent was obtained, the patient was brought to the operating room.  Following administration of spinal anesthesia, the patient was positioned in dorsal supine position with a leftward tilt and was prepped and draped in sterile fashion.  A preoperative time-out was performed.  The abdomen was entered in layers through a pfannenstiel incision and a retractor was placed.  A low transverse hysterotomy was created sharply to the level of the membranes, then extended bluntly.  The fetus was delivered from cephalic presentation onto the field.  Bulb suctioning was performed.  The cord was doubly clamped and cut after a 60 second pause.  The newborn was passed to the warmer.  The placenta was delivered.  The uterus was swept free of clots and debris and closed in a running locked fashion with 0-Monocryl. The left fallopian tube was lifted with a Babcock and divided at the mesosalpinx using the Ligasure device. This process was repeated on the contralateral side.  Hemostasis was verified.  The abdomen was irrigated with warmed saline and  cleared of clots. Subfascial spaces were inspected and hemostasis assured.  The fascia was closed in a running fashion with 0-PDS.  The subcutaneous tissues were irrigated and hemostasis assured.  The subcutaneous tissues were closed with 3-0 Monocryl.  The skin was closed with 4-0 Vicryl.  A sterile bandage was applied.  The patient was transferred to PACU.  All needle, sponge, and instrument counts were correct at the end of the case.   ?Disposition:  PACU ? ?I performed the procedure and the assistant was needed due to the complexity of the anatomy. ? ?Drema Dallas, DO ? ?

## 2021-07-01 LAB — CBC
HCT: 26.3 % — ABNORMAL LOW (ref 36.0–46.0)
Hemoglobin: 8.1 g/dL — ABNORMAL LOW (ref 12.0–15.0)
MCH: 25.3 pg — ABNORMAL LOW (ref 26.0–34.0)
MCHC: 30.8 g/dL (ref 30.0–36.0)
MCV: 82.2 fL (ref 80.0–100.0)
Platelets: 224 10*3/uL (ref 150–400)
RBC: 3.2 MIL/uL — ABNORMAL LOW (ref 3.87–5.11)
RDW: 17.4 % — ABNORMAL HIGH (ref 11.5–15.5)
WBC: 10.3 10*3/uL (ref 4.0–10.5)
nRBC: 0 % (ref 0.0–0.2)

## 2021-07-01 MED ORDER — ACETAMINOPHEN 500 MG PO TABS: 1000.0000 mg | ORAL_TABLET | Freq: Four times a day (QID) | ORAL | 0 refills | Status: AC | PRN

## 2021-07-01 MED ORDER — OXYCODONE HCL 5 MG PO TABS: 5.0000 mg | ORAL_TABLET | Freq: Four times a day (QID) | ORAL | 0 refills | Status: DC | PRN

## 2021-07-01 NOTE — Progress Notes (Signed)
CSW received consult due to score 12 on Edinburgh Depression Screen. CSW met with MOB at bedside to complete assessment, FOB present. MOB granted CSW verbal permission to speak in front of FOB about anything. CSW and MOB discussed edinburgh score 12. MOB shared that she was in some pain and had some nerves prior to delivery. MOB denied any additional stressors. MOB denied any mental health history. MOB endorsed a history of postpartum depression after having her daughter 14 years ago. MOB described her postpartum depression as being sad, isolating, and not wanting to do things. MOB reported that her postpartum depression lasted a couple months and went away on it's own. CSW inquired about how MOB was feeling emotionally since giving birth, MOB reported that she was feeling happy and became tearful. MOB reported that they were happy tears. MOB presented calm and did not demonstrate any acute mental health signs/symptoms. CSW assessed for safety, MOB denied SI and HI. CSW did not assess for domestic violence as FOB was present. CSW inquired about MOB's support system, MOB reported that her boyfriend, mom, and sister are supports. ? ?CSW provided education regarding Baby Blues vs PMADs and provided MOB with resources for mental health follow up.  CSW encouraged MOB to evaluate her mental health throughout the postpartum period with the use of the New Mom Checklist developed by [facility] [employer] as well as the [place] Postnatal Depression Scale and notify a medical professional if symptoms arise.    ?  ?CSW provided review of Sudden Infant Death Syndrome (SIDS) precautions. MOB verbalized understanding and reported that they have all items needed to care for infant including a car seat and basinet.  ? ?CSW identifies no further need for intervention and no barriers to discharge at this time. ? ?Abundio Miu, LCSW ?Clinical Social Worker ?[facility] ?Cell#: [phone] ? ? ?

## 2021-07-01 NOTE — Plan of Care (Signed)
?  Problem: Education: Goal: Knowledge of General Education information will improve Description: Including pain rating scale, medication(s)/side effects and non-pharmacologic comfort measures Outcome: Completed/Met   Problem: Clinical Measurements: Goal: Ability to maintain clinical measurements within normal limits will improve Outcome: Completed/Met Goal: Will remain free from infection Outcome: Completed/Met Goal: Diagnostic test results will improve Outcome: Completed/Met Goal: Respiratory complications will improve Outcome: Completed/Met Goal: Cardiovascular complication will be avoided Outcome: Completed/Met   Problem: Activity: Goal: Risk for activity intolerance will decrease Outcome: Completed/Met   Problem: Nutrition: Goal: Adequate nutrition will be maintained Outcome: Completed/Met   

## 2021-07-01 NOTE — Progress Notes (Signed)
Postpartum Note Day #1 ? ?S:  Patient doing well.  Pain controlled.  Tolerating regular diet.   Ambulating and voiding without difficulty. Has not yet passed flatus. Would like discharge home tomorrow. States her pharmacy is closed tomorrow but gets her medications cheaper there so would appreciate prescription to be sent today. Denies fevers, chills, chest pain, SOB, N/V, or worsening bilateral LE edema. ? ?Lochia: Moderate ?Infant feeding:  Breast ?Circumcision:  Desires prior to discharge ?Contraception:  S/p bilateral salpingectomy at time of CS ? ?O: Temp:  [97.8 ?F (36.6 ?C)-98.8 ?F (37.1 ?C)] 98 ?F (36.7 ?C) (03/11 0515) ?Pulse Rate:  [51-85] 51 (03/11 0515) ?Resp:  [15-21] 16 (03/11 0515) ?BP: (98-123)/(52-86) 110/57 (03/11 0515) ?SpO2:  [100 %] 100 % (03/11 0515) ?Gen: NAD, pleasant and cooperative ?Resp: No increased WOB ?Abdomen: soft, non-distended, non-tender throughout ?Uterus: firm, non-tender, below umbilicus ?Incision: c/d/i, bandage in place  ?Ext: No bilateral LE edema, no bilateral calf tenderness ? ?Labs:  ?CBC Latest Ref Rng & Units 07/01/2021 06/28/2021 01/07/2021  ?WBC 4.0 - 10.5 K/uL 10.3 6.6 6.8  ?Hemoglobin 12.0 - 15.0 g/dL 8.1(L) 9.4(L) 10.9(L)  ?Hematocrit 36.0 - 46.0 % 26.3(L) 31.3(L) 33.3(L)  ?Platelets 150 - 400 K/uL 224 231 227  ? ? ?A/P: Patient is a 36 y.o. [id number] POD#1 s/p repeat LTCS+BS ? ?S/p LTCS ?- Pain well controlled  ?- GU: UOP is adequate ?- GI: Tolerating regular diet ?- Activity: encouraged sitting up to chair and ambulation as tolerated ?- DVT Prophylaxis: SCDs ?- Labs: as above ? ?History of gastric sleeve ?- Unable to take NSAIDs ? ?Circumcision consent: ?Routine circumcisions performed on newborns have been identified as voluntary, elective procedures by [employer] such as the [employer] [facility].  It is considered an elective procedure with no definitive medical indication and carries risks. ? ?Risks include but are not limited to bleeding,  infection, damage to penis with possible need for further surgery, poor cosmesis, and local anesthetic risks. ? ?Circumcision will only be performed if patient is deemed to have normal anatomy by his Pediatrician, meets adequate criteria for a newborn of similar gestational age after birth and is without infection or other medical issue contraindicating an elective procedure. ? ? ?Patient understands and agrees with above consent ?Patient discussed with parents of infant. ? ? ?Disposition:  D/C home POD#2-3  ? ?[name], DO ?[phone] (office) ? ? ? ? ? ? ?

## 2021-07-01 NOTE — Lactation Note (Signed)
This note was copied from a baby's chart. ?Lactation Consultation Note ? ?Patient Name: Kimberly Dennis ?Today's Date: 07/01/2021 ?Reason for consult: Follow-up assessment;Difficult latch;Term ?Age:37 hours ? ?Mom states infant has latched once and nipple is painful from that feed. ? ?Infant thrusting nipple, suck training done. ?LC assisted mom with latching in football hold.  Mom unable to latch independently.   ? ?Sucking noted and some swallows heard with compressions.   ?When baby came off, nipple was pinched. ?Lips were blanched.  Tight labial frenulum noted during assessment. ? ?Mom was encouraged to pump every 3 hours, goal of 8 X daily. or when baby bottle feeds. ?Attempt to latch, hand expressing first.   ? ?LC encouraged mom to protect milk supply by pumping and to continue trying to latch.  OP appt encouraged and mom is aware message is being sent.   ? ? ? ?Maternal Data ?Has patient been taught Hand Expression?: Yes ? ?Feeding ?Mother's Current Feeding Choice: Breast Milk and Formula ?Nipple Type: Nfant Standard Flow (white) ? ?LATCH Score ?Latch: Repeated attempts needed to sustain latch, nipple held in mouth throughout feeding, stimulation needed to elicit sucking reflex. ? ?Audible Swallowing: A few with stimulation ? ?Type of Nipple: Everted at rest and after stimulation (shorter shaft, easily compressible tissue) ? ?Comfort (Breast/Nipple): Filling, red/small blisters or bruises, mild/mod discomfort (nipple tender on the right side where infant fed yesterday) ? ?Hold (Positioning): Assistance needed to correctly position infant at breast and maintain latch. ? ?LATCH Score: 6 ? ? ?Lactation Tools Discussed/Used ?Tools:  (RN to set up DEBP for mom) ? ?Interventions ?Interventions: Breast feeding basics reviewed;Assisted with latch;Skin to skin;Breast massage;Hand express;Support pillows;Position options ? ?Discharge ?Pump: DEBP ? ?Consult Status ?Consult Status: Follow-up ?Date:  07/02/21 ?Follow-up type: In-patient ? ? ? ?Maryruth Hancock Amad Mau ?07/01/2021, 8:38 AM ? ? ? ?

## 2021-07-02 NOTE — Discharge Summary (Signed)
Postpartum Discharge Summary ? ?Date of Service: 07/02/21  ? ?   ?Patient Name: Kimberly Dennis ?DOB: [birth year] ?MRN: [id number] ? ?Date of admission: 06/30/2021 ?Delivery date:06/30/2021  ?Delivering provider: Hubert Raatz  ?Date of discharge: 07/02/2021 ? ?Admitting diagnosis: History of Cesarean Delivery ?Intrauterine pregnancy: [redacted]w[redacted]d    ?Secondary diagnosis:  Active Problems: ?  History of cesarean delivery ? ?Additional problems: History of cesarean section, Hypothyroidism/thyromegaly, Obesity (BMI 32), Advanced Maternal Age, History of gastric sleeve, Resolved low-lying placenta, Desires permanent sterilization   ?Discharge diagnosis: Term Pregnancy Delivered                                              ?Post partum procedures: None ?Augmentation: N/A ?Complications: None ? ?Hospital course: Sceduled C/S   36y.o. yo [id number] admitted to the hospital 06/30/2021 for scheduled cesarean section with the following indication:Elective Primary.Delivery details are as follows:  ?Membrane Rupture Time/Date: 12:58 PM ,06/30/2021   ?Delivery Method:C-Section, Low Transverse  ?She also had a bilateral salpingectomy at the time of cesarean section for desire for permanent sterilization. Details of operation can be found in separate operative note.  Patient had an uncomplicated postpartum course.  She is ambulating, tolerating a regular diet, passing flatus, and urinating well. Patient is discharged home in stable condition on  07/02/21 ?       ?Newborn Data: ?Birth date:06/30/20[birth year]  ?Birth time:12:59 PM  ?Gender:Female  ?Living status:Living  ?Apgars:9 ,9  ?Weight:3570 g    ? ?Magnesium Sulfate received: No ?BMZ received: No ?Rhophylac:N/A ?MMR:N/A ?T-DaP:Given prenatally ?Flu: No ?Transfusion:No ? ?Physical exam  ?Vitals:  ? 07/01/21 0515 07/01/21 1414 07/01/21 2011 07/02/21 0527  ?BP: (!) 110/57 (!) 111/56 114/62 112/60  ?Pulse: (!) 51 (!) 57 63 68  ?Resp: _0 ?Temp: 98 ?F (36.7 ?C) 99.4 ?F (37.4 ?C) 97.9  ?F (36.6 ?C)   ?TempSrc: Oral Oral Oral   ?SpO2: 100%  98%   ?Weight:      ?Height:      ? ?General: alert, cooperative, and no distress ?Lochia: appropriate ?Uterine Fundus: firm ?Incision: Healing well with no significant drainage, Dressing is clean, dry, and intact ?DVT Evaluation: No evidence of DVT seen on physical exam. ?Labs: ?Lab Results  ?Component Value Date  ? WBC 10.3 07/01/2021  ? HGB 8.1 (L) 07/01/2021  ? HCT 26.3 (L) 07/01/2021  ? MCV 82.2 07/01/2021  ? PLT 224 07/01/2021  ? ?CMP Latest Ref Rng & Units 07/24/2018  ?Glucose 70 - 99 mg/dL 132(H)  ?BUN 6 - 20 mg/dL 14  ?Creatinine 0.44 - 1.00 mg/dL 0.77  ?Sodium 135 - 145 mmol/L 138  ?Potassium 3.5 - 5.1 mmol/L 3.5  ?Chloride 98 - 111 mmol/L 106  ?CO2 22 - 32 mmol/L 24  ?Calcium 8.9 - 10.3 mg/dL 9.0  ?Total Protein 6.5 - 8.1 g/dL 6.8  ?Total Bilirubin 0.3 - 1.2 mg/dL 1.2  ?Alkaline Phos 38 - 126 U/L 46  ?AST 15 - 41 U/L 29  ?ALT 0 - 44 U/L 21  ? ?Edinburgh Score: ?Edinburgh Postnatal Depression Scale Screening Tool 06/30/2021  ?I have been able to laugh and see the funny side of things. 0  ?I have looked forward with enjoyment to things. 0  ?I have blamed myself unnecessarily when things went wrong. 1  ?I have been anxious or worried for no  good reason. 2  ?I have felt scared or panicky for no good reason. 2  ?Things have been getting on top of me. 2  ?I have been so unhappy that I have had difficulty sleeping. 2  ?I have felt sad or miserable. 2  ?I have been so unhappy that I have been crying. 1  ?The thought of harming myself has occurred to me. 0  ?Edinburgh Postnatal Depression Scale Total 12  ? ? ? ? ?After visit meds:  ?Allergies as of 07/02/2021   ?No Known Allergies ?  ? ?  ?Medication List  ?  ? ?STOP taking these medications   ? ?aspirin EC 81 MG tablet ?  ? ?  ? ?TAKE these medications   ? ?acetaminophen 500 MG tablet ?Commonly known as: TYLENOL ?Take 2 tablets (1,000 mg total) by mouth every 6 (six) hours as needed for moderate pain, headache or  mild pain. ?  ?ferrous sulfate 325 (65 FE) MG tablet ?Take 325 mg by mouth daily with breakfast. ?  ?multivitamin with minerals Tabs tablet ?Take 1 tablet by mouth daily. ?  ?multivitamin-prenatal 27-0.8 MG Tabs tablet ?Take 1 tablet by mouth daily at 12 noon. ?  ?oxyCODONE 5 MG immediate release tablet ?Commonly known as: Oxy IR/ROXICODONE ?Take 1-2 tablets (5-10 mg total) by mouth every 6 (six) hours as needed for moderate pain, severe pain or breakthrough pain. ?  ? ?  ? ? ? ?Discharge home in stable condition ?Infant Feeding: Breast ?Infant Disposition:home with mother ?Discharge instruction: per After Visit Summary and Postpartum booklet. ?Activity: Advance as tolerated. Pelvic rest for 6 weeks.  ?Diet: routine diet ?Anticipated Birth Control:  S/p bilateral salpingectomy ?Postpartum Appointment:6 weeks ?Additional Postpartum F/U: Incision check 2 weeks ?Future Appointments:No future appointments. ?Follow up Visit: ? Follow-up Information   ? ? Drema Dallas, DO Follow up in 2 week(s).   ?Specialty: Obstetrics and Gynecology ?Why: Please keep your 2 week incision check and 6 week postpartum visit. ?Contact information: ?[address] ?[address] ?[place] [address] ?[phone] ? ? ?  ?  ? ?  ?  ? ?  ? ? ? ?  ? ?07/02/2021 ?Drema Dallas, DO ? ? ?

## 2021-07-02 NOTE — Plan of Care (Signed)
?  Problem: Health Behavior/Discharge Planning: ?Goal: Ability to manage health-related needs will improve ?Outcome: Completed/Met ?  ?Problem: Coping: ?Goal: Level of anxiety will decrease ?Outcome: Completed/Met ?  ?Problem: Elimination: ?Goal: Will not experience complications related to bowel motility ?Outcome: Completed/Met ?Goal: Will not experience complications related to urinary retention ?Outcome: Completed/Met ?  ?Problem: Pain Managment: ?Goal: General experience of comfort will improve ?Outcome: Completed/Met ?  ?Problem: Safety: ?Goal: Ability to remain free from injury will improve ?Outcome: Completed/Met ?  ?Problem: Skin Integrity: ?Goal: Risk for impaired skin integrity will decrease ?Outcome: Completed/Met ?  ?Problem: Education: ?Goal: Knowledge of condition will improve ?Outcome: Completed/Met ?Goal: Individualized Educational Video(s) ?Outcome: Completed/Met ?Goal: Individualized Newborn Educational Video(s) ?Outcome: Completed/Met ?  ?Problem: Activity: ?Goal: Will verbalize the importance of balancing activity with adequate rest periods ?Outcome: Completed/Met ?Goal: Ability to tolerate increased activity will improve ?Outcome: Completed/Met ?  ?Problem: Coping: ?Goal: Ability to identify and utilize available resources and services will improve ?Outcome: Completed/Met ?  ?Problem: Life Cycle: ?Goal: Chance of risk for complications during the postpartum period will decrease ?Outcome: Completed/Met ?  ?Problem: Role Relationship: ?Goal: Ability to demonstrate positive interaction with newborn will improve ?Outcome: Completed/Met ?  ?Problem: Skin Integrity: ?Goal: Demonstration of wound healing without infection will improve ?Outcome: Completed/Met ?  ?

## 2021-07-03 LAB — SURGICAL PATHOLOGY

## 2021-07-05 MED FILL — Sodium Chloride IV Soln 0.9%: INTRAVENOUS | Qty: 1000 | Status: AC

## 2021-07-05 MED FILL — Heparin Sodium (Porcine) Inj 1000 Unit/ML: INTRAMUSCULAR | Qty: 30 | Status: AC

## 2021-07-11 ENCOUNTER — Telehealth (HOSPITAL_COMMUNITY): Payer: Self-pay

## 2021-07-11 NOTE — Telephone Encounter (Signed)
No answer. Left message to return nurse call. ? Jerald Kief ?07/11/2021,1359 ?

## 2021-08-04 ENCOUNTER — Encounter (HOSPITAL_COMMUNITY): Payer: Self-pay | Admitting: *Deleted

## 2022-01-23 IMAGING — US US OB < 14 WEEKS - US OB TV
1 series · 15 of 28 positions shown · non-contrast
Comparison: None.

CLINICAL DATA: Vaginal bleeding since this morning. Quantitative
beta HCG is not provided. Estimated gestational age by LMP is 5
weeks 4 days.

EXAM:
OBSTETRIC <14 WK US AND TRANSVAGINAL OB US
TECHNIQUE: Both transabdominal and transvaginal ultrasound examinations were
performed for complete evaluation of the gestation as well as the
maternal uterus, adnexal regions, and pelvic cul-de-sac.
Transvaginal technique was performed to assess early pregnancy.

[Series 1: us ob < 14 weeks - us ob tv · 15 of 45 slices shown]
[im 1/45]
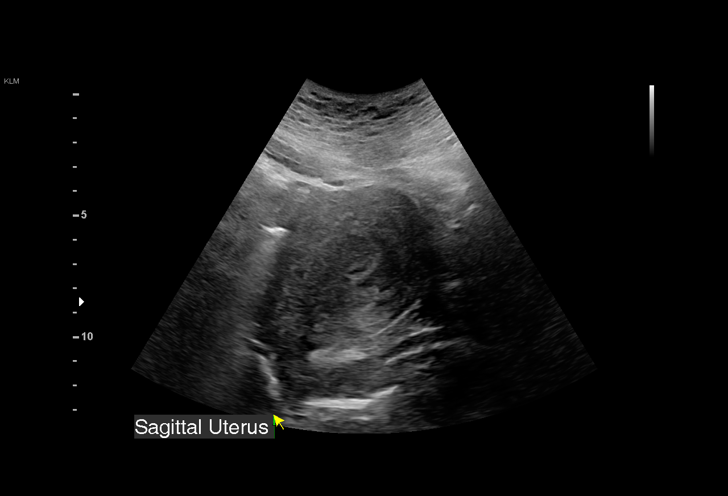
[im 4/45]
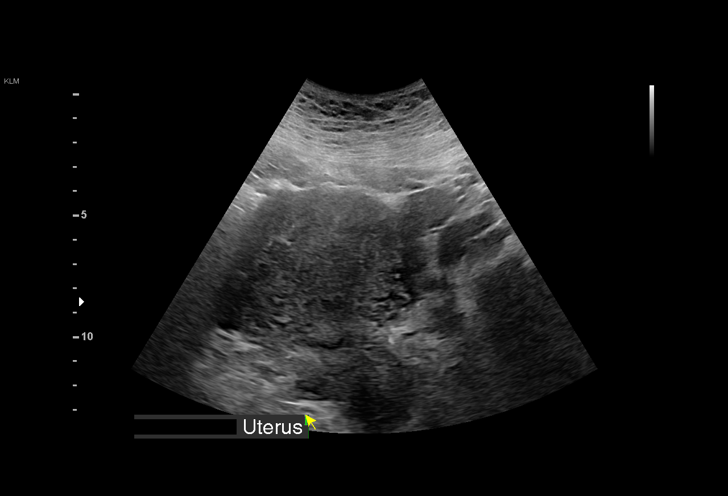
[im 7/45]
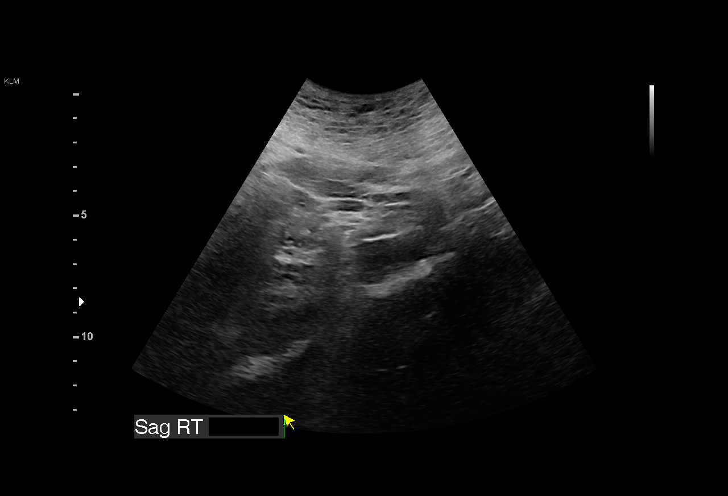
[im 10/45]
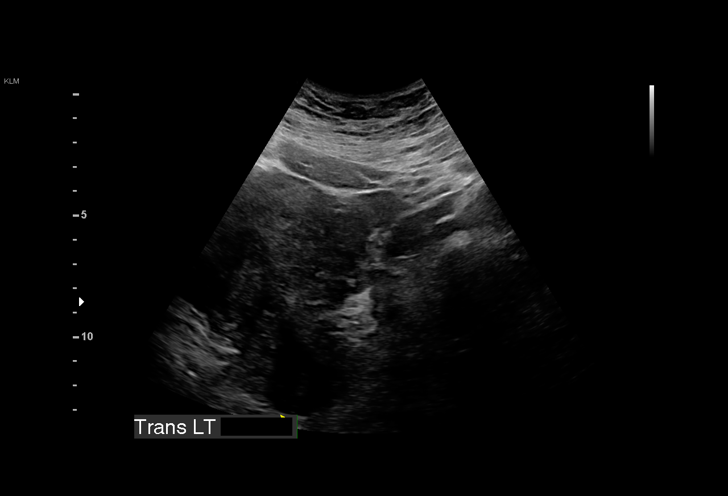
[im 14/45]
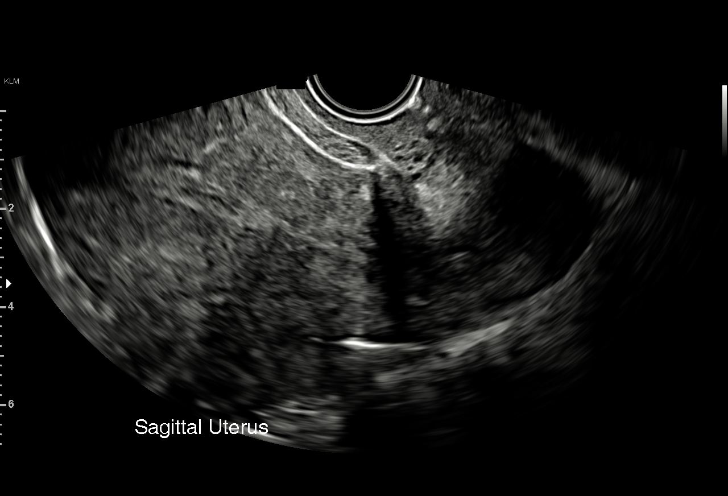
[im 17/45]
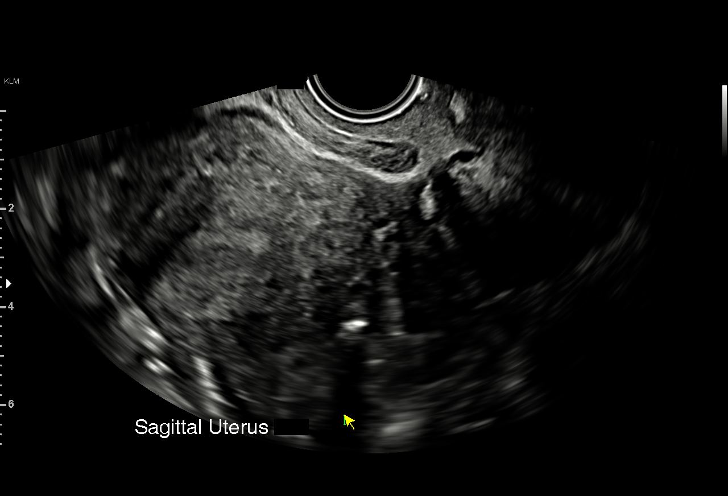
[im 20/45]
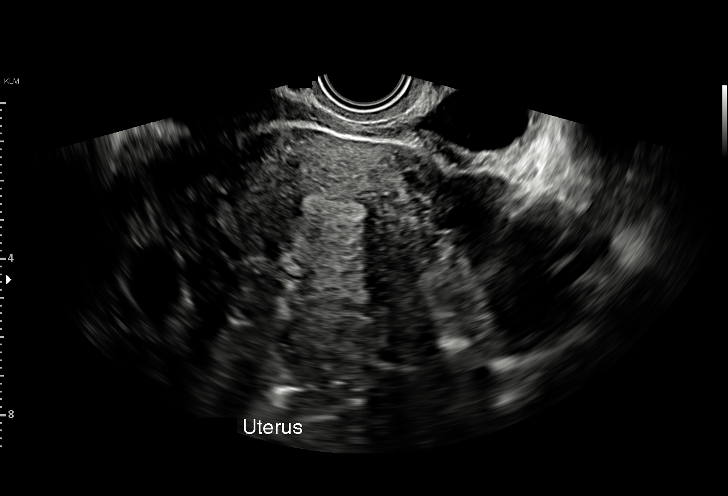
[im 23/45]
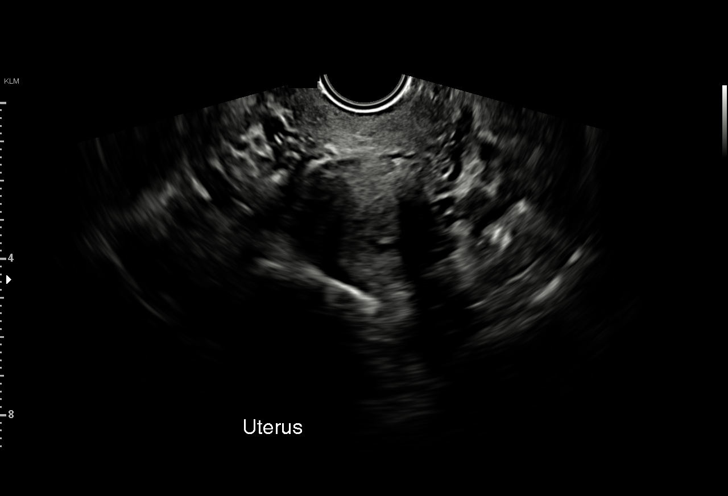
[im 25/45]
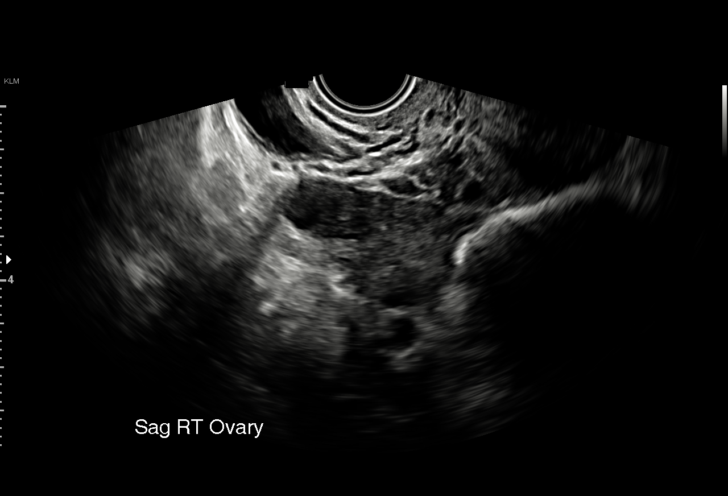
[im 28/45]
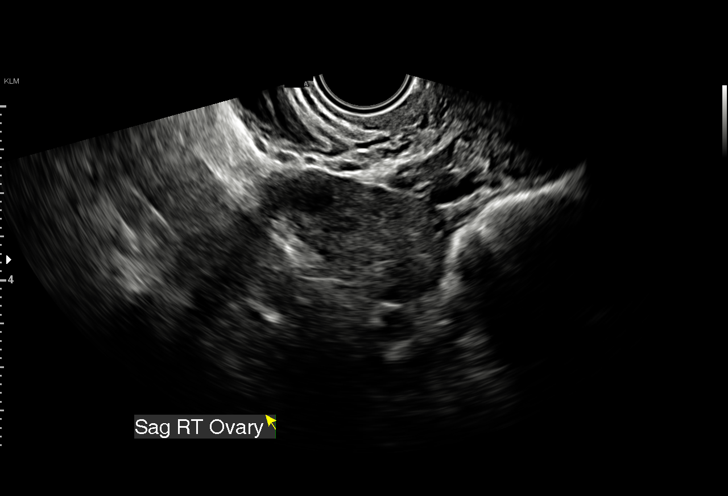
[im 31/45]
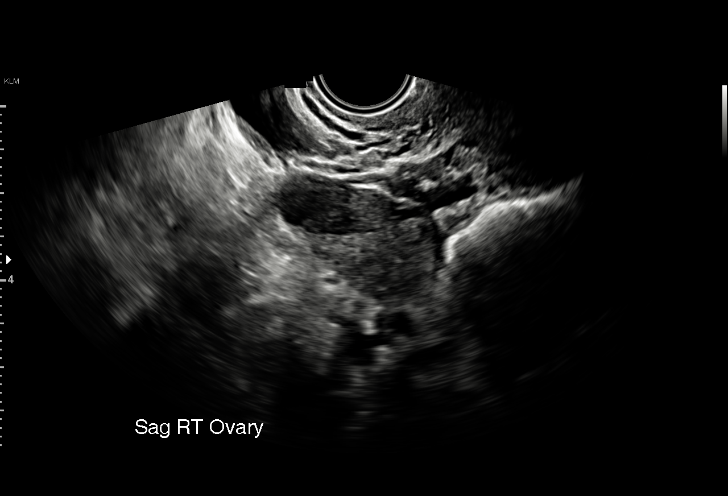
[im 35/45]
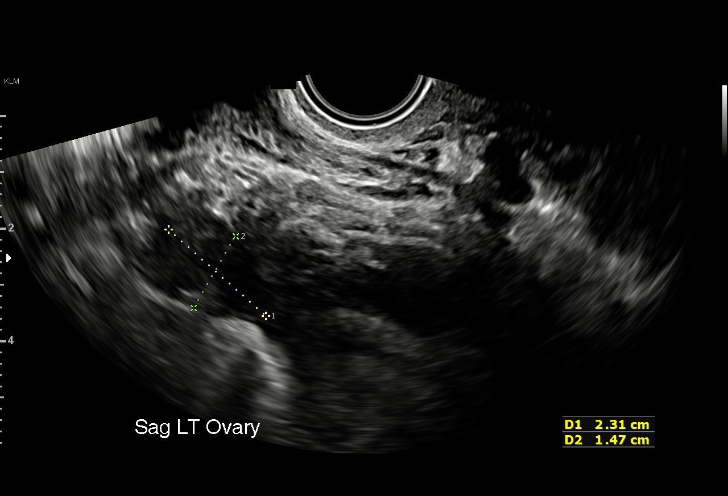
[im 38/45]
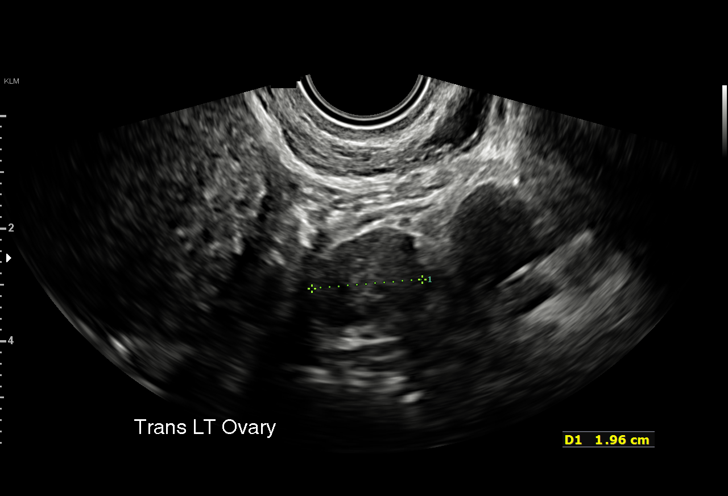
[im 41/45]
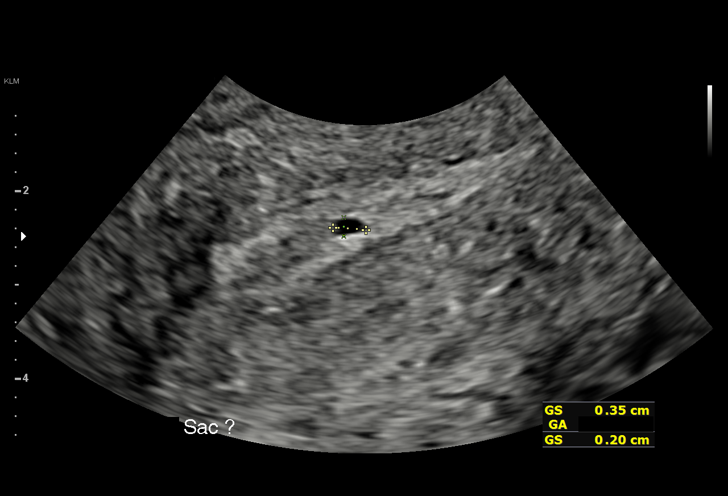
[im 45/45]
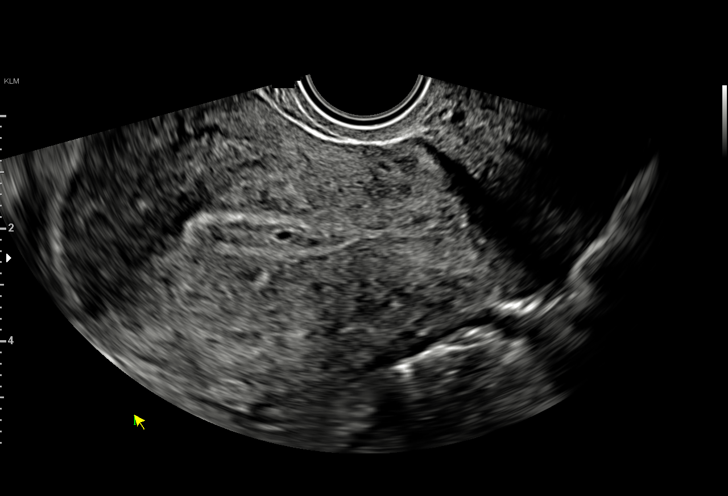

[15 of 28 positions shown; findings below may reference images not displayed]

FINDINGS: Intrauterine gestational sac: A small cystic structure is
demonstrated in the endometrial canal, possibly representing an
early intrauterine gestational sac.

Yolk sac:  Not identified.

Embryo:  Not identified.

Cardiac Activity: Not identified.

MSD: 3 mm   4 w   6 d

Subchorionic hemorrhage:  None visualized.

Maternal uterus/adnexae: The uterus is anteverted. No myometrial
mass lesions are identified. Both ovaries are visualized and appear
normal. No abnormal adnexal masses. No free fluid in the pelvis.
IMPRESSION: Possible early intrauterine gestational sac, but no yolk sac, fetal
pole, or cardiac activity yet visualized. Recommend follow-up
quantitative B-HCG levels and follow-up US in 14 days to assess
viability. This recommendation follows SRU consensus guidelines:
Diagnostic Criteria for Nonviable Pregnancy Early in the First
Trimester. N Engl J Med 8093; [DATE].

## 2022-08-10 ENCOUNTER — Encounter (HOSPITAL_COMMUNITY): Payer: Self-pay | Admitting: *Deleted

## 2022-09-11 ENCOUNTER — Ambulatory Visit: Admit: 2022-09-11 | Discharge status: Against Medical Advice | Payer: 59

## 2023-03-14 ENCOUNTER — Ambulatory Visit (HOSPITAL_BASED_OUTPATIENT_CLINIC_OR_DEPARTMENT_OTHER): Payer: 59 | Admitting: Family Medicine

## 2023-03-14 ENCOUNTER — Encounter (HOSPITAL_BASED_OUTPATIENT_CLINIC_OR_DEPARTMENT_OTHER): Payer: Self-pay | Admitting: Family Medicine

## 2023-03-14 VITALS — BP 112/66 | HR 69 | Ht 66.0 in | Wt 225.5 lb

## 2023-03-14 DIAGNOSIS — Z0000 Encounter for general adult medical examination without abnormal findings: Secondary | ICD-10-CM

## 2023-03-14 DIAGNOSIS — Z9884 Bariatric surgery status: Secondary | ICD-10-CM | POA: Diagnosis not present

## 2023-03-14 DIAGNOSIS — F321 Major depressive disorder, single episode, moderate: Secondary | ICD-10-CM | POA: Diagnosis not present

## 2023-03-14 DIAGNOSIS — F419 Anxiety disorder, unspecified: Secondary | ICD-10-CM | POA: Diagnosis not present

## 2023-03-14 DIAGNOSIS — Z7689 Persons encountering health services in other specified circumstances: Secondary | ICD-10-CM

## 2023-03-14 MED ORDER — ZOLPIDEM TARTRATE 5 MG PO TABS: 5.0000 mg | ORAL_TABLET | Freq: Every evening | ORAL | 0 refills | Status: DC | PRN

## 2023-03-14 MED ORDER — BUPROPION HCL ER (XL) 300 MG PO TB24: 300.0000 mg | ORAL_TABLET | Freq: Every day | ORAL | 2 refills | Status: AC

## 2023-03-14 NOTE — Progress Notes (Signed)
New Patient Office Visit  Subjective    Patient ID: Kimberly Dennis, female    DOB: March 22, 1986  Age: 37 y.o. MRN: [id number]  CC:  Chief Complaint  Patient presents with   Depression   Anxiety    On wellbutrin since June, anxiety is really bad around a lot of people   Insomnia    If she does not take [place] she does not sleep,   HPI Kimberly Dennis is a 37 year old female who presents to establish with Primary [facility] at [facility].  Former PCP:  Last physical: 2023  Past surgical history- gastric sleeve surgery 2019 Past medical history- anxiety, depression,insomnia   MOOD- "kind of handled" Bupropion 150mg , some days is better than some  Lamictal 25mg  twice daily-- not taking anymore Really bad anxiety after being around a lot of people  She is currently involved in therapy  Fluctuates between eating a lot and sometimes she does not have appetite   INSOMNIA- Ambien 5mg  at bedtime prn  Since August, after he nephew passed  Tried trazodone, had to take 3 tablets and did not notice an improvement in her sleep   WEIGHT LOSS- her normal 180-195lb after surgery  Now she is 225lb  Walks about 3 miles a day at work  Has been watching her food intake, trying to eat health  Would be interested in medication to aid in her weight loss journey      03/14/2023    2:49 PM  GAD 7 : Generalized Anxiety Score  Nervous, Anxious, on Edge 1  Control/stop worrying 0  Worry too much - different things 2  Trouble relaxing 0  Restless 0  Easily annoyed or irritable 2  Afraid - awful might happen 2  Total GAD 7 Score 7  Anxiety Difficulty Somewhat difficult       03/14/2023    2:49 PM 05/01/2018    8:04 AM 10/09/2017    8:01 AM  PHQ9 SCORE ONLY  PHQ-9 Total Score 8 0 0   Outpatient Encounter Medications as of 03/14/2023  Medication Sig   acetaminophen (TYLENOL) 500 MG tablet Take 2 tablets (1,000 mg total) by mouth every 6 (six) hours as  needed for moderate pain, headache or mild pain.   Multiple Vitamin (MULTIVITAMIN WITH MINERALS) TABS tablet Take 1 tablet by mouth daily.   [DISCONTINUED] buPROPion (WELLBUTRIN XL) 150 MG 24 hr tablet Take 150 mg by mouth daily.   [DISCONTINUED] lamoTRIgine (LAMICTAL) 25 MG tablet Take by mouth 2 (two) times daily.   [DISCONTINUED] zolpidem (AMBIEN) 5 MG tablet Take 5 mg by mouth at bedtime as needed.   buPROPion (WELLBUTRIN XL) 300 MG 24 hr tablet Take 1 tablet (300 mg total) by mouth daily.   zolpidem (AMBIEN) 5 MG tablet Take 1 tablet (5 mg total) by mouth at bedtime as needed.   [DISCONTINUED] ferrous sulfate 325 (65 FE) MG tablet Take 325 mg by mouth daily with breakfast. (Patient not taking: Reported on 03/14/2023)   [DISCONTINUED] oxyCODONE (OXY IR/ROXICODONE) 5 MG immediate release tablet Take 1-2 tablets (5-10 mg total) by mouth every 6 (six) hours as needed for moderate pain, severe pain or breakthrough pain. (Patient not taking: Reported on 03/14/2023)   [DISCONTINUED] Prenatal Vit-Fe Fumarate-FA (MULTIVITAMIN-PRENATAL) 27-0.8 MG TABS tablet Take 1 tablet by mouth daily at 12 noon. (Patient not taking: Reported on 03/14/2023)   No facility-administered encounter medications on file as of 03/14/2023.    Past Medical History:  Diagnosis Date  Anemia    Anxiety    Depression    GERD (gastroesophageal reflux disease)    HPV (human papilloma virus) infection    Hypothyroidism    Obesity    Thyromegaly    Vaginal Pap smear, abnormal     Past Surgical History:  Procedure Laterality Date   CESAREAN SECTION     CESAREAN SECTION WITH BILATERAL TUBAL LIGATION N/A 06/30/2021   Procedure: CESAREAN SECTION WITH BILATERAL SALPINGECTOMY;  Surgeon: [name], DO;  Location: [facility];  Service: Obstetrics;  Laterality: N/A;   LAPAROSCOPIC GASTRIC SLEEVE RESECTION N/A 01/20/2018   Procedure: LAPAROSCOPIC GASTRIC SLEEVE RESECTION WITH UPPER ENDO AND ERAS PATHWAY;  Surgeon: Kinsinger,  De Blanch, MD;  Location: [facility];  Service: General;  Laterality: N/A;   TUBAL LIGATION      Family History  Problem Relation Age of Onset   Hypertension Mother    Diabetes Paternal Grandmother    Review of Systems  Constitutional:  Negative for malaise/fatigue and weight loss.  Respiratory:  Negative for cough and shortness of breath.   Cardiovascular:  Negative for chest pain, palpitations, orthopnea, leg swelling and PND.  Gastrointestinal:  Negative for abdominal pain, nausea and vomiting.  Genitourinary:  Negative for frequency and urgency.  Musculoskeletal:  Negative for myalgias.  Neurological:  Negative for dizziness and headaches.  Psychiatric/Behavioral:  Positive for depression. Negative for hallucinations, substance abuse and suicidal ideas. The patient is nervous/anxious and has insomnia.         Objective    BP 112/66   Pulse 69   Ht 5\' 6"  (1.676 m)   Wt 225 lb 8 oz (102.3 kg)   LMP 02/24/2023 (Exact Date)   SpO2 98%   Breastfeeding No   BMI 36.40 kg/m   Physical Exam Vitals reviewed.  Constitutional:      Appearance: Normal appearance. She is obese.  Cardiovascular:     Rate and Rhythm: Normal rate and regular rhythm.     Pulses: Normal pulses.     Heart sounds: Normal heart sounds.  Pulmonary:     Effort: Pulmonary effort is normal.     Breath sounds: Normal breath sounds.  Neurological:     Mental Status: She is alert.  Psychiatric:        Mood and Affect: Mood normal.        Behavior: Behavior normal.      Assessment & Plan:   1. Encounter to establish care Patient is a 37- year-old female who presents today to establish care with primary care at [facility]. Reviewed the past medical history, family history, social history, surgical history, medications and allergies today- updates made as indicated. She has concerns today about her mood and difficulty losing weight.   2. Morbid obesity (HCC) BMI 36.40. Patient reports having a  difficult time losing excess weight, despite healthy nutrition and daily anaerobic exercise. She would be interested in pharmacotherapy. Denies past medical history of medullary thyroid cancer and personal history of pancreatitis. She would like to start GLP-1 injectable medication to assist with lifestyle modifications. Reviewed potential side effects with this class of medication, discussed appropriate administration as well as gradual titration moving forward. We will look to initiate GLP-1 receptor agonist. Will wait to send medication until hemoglobin A1c returns. If she is able to start with medication, recommend that she schedule follow-up in about 4 weeks after starting.    3. Anxiety Patient reports history of anxiety. GAD7 completed with score of 7. She  has been taking Wellbutrin 150mg  daily and does notice an improvement in her mood, but reports there are times where she does experience an increase in her anxiety levels. She would be interested in increasing her Wellbutrin at this time. Discussed if this does not help with her anxiety, can add SSRI for additional management.   4. Depression, major, single episode, moderate (HCC) PHQ9 completed with score of 8. Denies Si/HI. Increased Wellbutrin from 150mg  daily to 300mg  daily to help with mood. Advised patient this can take 4-6 weeks to notice maximum therapeutic effect. Will follow-up in 4 weeks.   5. Wellness examination Patient is fasting today. Will obtain routine HCM labs and plan to complete physical in near future.  - CBC with Differential/Platelet - Comprehensive metabolic panel - Hemoglobin A1c - Lipid panel - TSH Rfx on Abnormal to Free T4  6. History of gastric bypass Patient reports history of gastric bypass and does not take supplements as recommended. Plan to assess vitamin D level.  - VITAMIN D 25 Hydroxy (Vit-D Deficiency, Fractures)   Return in about 4 weeks (around 04/11/2023) for Mood f/u & weight loss .    Alyson Reedy, FNP

## 2023-03-14 NOTE — Patient Instructions (Signed)

## 2023-03-15 LAB — CBC WITH DIFFERENTIAL/PLATELET
Basophils Absolute: 0 10*3/uL (ref 0.0–0.2)
Basos: 1 %
EOS (ABSOLUTE): 0 10*3/uL (ref 0.0–0.4)
Eos: 1 %
Hematocrit: 33.5 % — ABNORMAL LOW (ref 34.0–46.6)
Hemoglobin: 9.8 g/dL — ABNORMAL LOW (ref 11.1–15.9)
Immature Grans (Abs): 0 10*3/uL (ref 0.0–0.1)
Immature Granulocytes: 0 %
Lymphocytes Absolute: 2.1 10*3/uL (ref 0.7–3.1)
Lymphs: 36 %
MCH: 23 pg — ABNORMAL LOW (ref 26.6–33.0)
MCHC: 29.3 g/dL — ABNORMAL LOW (ref 31.5–35.7)
MCV: 79 fL (ref 79–97)
Monocytes Absolute: 0.5 10*3/uL (ref 0.1–0.9)
Monocytes: 8 %
Neutrophils Absolute: 3.3 10*3/uL (ref 1.4–7.0)
Neutrophils: 54 %
Platelets: 280 10*3/uL (ref 150–450)
RBC: 4.27 x10E6/uL (ref 3.77–5.28)
RDW: 16.4 % — ABNORMAL HIGH (ref 11.7–15.4)
WBC: 5.9 10*3/uL (ref 3.4–10.8)

## 2023-03-15 LAB — COMPREHENSIVE METABOLIC PANEL
ALT: 10 [IU]/L (ref 0–32)
AST: 14 [IU]/L (ref 0–40)
Albumin: 4.5 g/dL (ref 3.9–4.9)
Alkaline Phosphatase: 63 [IU]/L (ref 44–121)
BUN/Creatinine Ratio: 15 (ref 9–23)
BUN: 13 mg/dL (ref 6–20)
Bilirubin Total: 0.5 mg/dL (ref 0.0–1.2)
CO2: 22 mmol/L (ref 20–29)
Calcium: 9.6 mg/dL (ref 8.7–10.2)
Chloride: 105 mmol/L (ref 96–106)
Creatinine, Ser: 0.84 mg/dL (ref 0.57–1.00)
Globulin, Total: 3.1 g/dL (ref 1.5–4.5)
Glucose: 74 mg/dL (ref 70–99)
Potassium: 4.3 mmol/L (ref 3.5–5.2)
Sodium: 141 mmol/L (ref 134–144)
Total Protein: 7.6 g/dL (ref 6.0–8.5)
eGFR: 91 mL/min/{1.73_m2} (ref >59)

## 2023-03-15 LAB — LIPID PANEL
Chol/HDL Ratio: 2.2 ratio (ref 0.0–4.4)
Cholesterol, Total: 171 mg/dL (ref 100–199)
HDL: 77 mg/dL (ref >39)
LDL Chol Calc (NIH): 79 mg/dL (ref 0–99)
Triglycerides: 84 mg/dL (ref 0–149)
VLDL Cholesterol Cal: 15 mg/dL (ref 5–40)

## 2023-03-15 LAB — VITAMIN D 25 HYDROXY (VIT D DEFICIENCY, FRACTURES): Vit D, 25-Hydroxy: 10.6 ng/mL — ABNORMAL LOW (ref 30.0–100.0)

## 2023-03-15 LAB — HEMOGLOBIN A1C
Est. average glucose Bld gHb Est-mCnc: 100 mg/dL
Hgb A1c MFr Bld: 5.1 % (ref 4.8–5.6)

## 2023-03-15 LAB — TSH RFX ON ABNORMAL TO FREE T4: TSH: 3.57 u[IU]/mL (ref 0.450–4.500)

## 2023-03-18 ENCOUNTER — Other Ambulatory Visit (HOSPITAL_BASED_OUTPATIENT_CLINIC_OR_DEPARTMENT_OTHER): Payer: Self-pay | Admitting: Family Medicine

## 2023-03-18 DIAGNOSIS — E559 Vitamin D deficiency, unspecified: Secondary | ICD-10-CM

## 2023-03-18 MED ORDER — WEGOVY 0.25 MG/0.5ML ~~LOC~~ SOAJ: 0.2500 mg | SUBCUTANEOUS | 2 refills | Status: DC

## 2023-03-18 MED ORDER — VITAMIN D (ERGOCALCIFEROL) 1.25 MG (50000 UNIT) PO CAPS: 50000.0000 [IU] | ORAL_CAPSULE | ORAL | 0 refills | Status: AC

## 2023-03-25 ENCOUNTER — Other Ambulatory Visit (HOSPITAL_BASED_OUTPATIENT_CLINIC_OR_DEPARTMENT_OTHER): Payer: Self-pay | Admitting: *Deleted

## 2023-03-25 ENCOUNTER — Encounter (HOSPITAL_BASED_OUTPATIENT_CLINIC_OR_DEPARTMENT_OTHER): Payer: Self-pay | Admitting: *Deleted

## 2023-03-25 DIAGNOSIS — E559 Vitamin D deficiency, unspecified: Secondary | ICD-10-CM

## 2023-04-10 ENCOUNTER — Ambulatory Visit (HOSPITAL_BASED_OUTPATIENT_CLINIC_OR_DEPARTMENT_OTHER): Payer: 59 | Admitting: Family Medicine

## 2023-05-01 ENCOUNTER — Ambulatory Visit (HOSPITAL_BASED_OUTPATIENT_CLINIC_OR_DEPARTMENT_OTHER): Payer: 59 | Admitting: Family Medicine

## 2023-05-01 ENCOUNTER — Encounter (HOSPITAL_BASED_OUTPATIENT_CLINIC_OR_DEPARTMENT_OTHER): Payer: Self-pay

## 2023-06-26 ENCOUNTER — Other Ambulatory Visit (HOSPITAL_BASED_OUTPATIENT_CLINIC_OR_DEPARTMENT_OTHER): Payer: 59

## 2023-06-27 LAB — VITAMIN D 25 HYDROXY (VIT D DEFICIENCY, FRACTURES): Vit D, 25-Hydroxy: 10.6 ng/mL — ABNORMAL LOW (ref 30.0–100.0)

## 2023-07-02 ENCOUNTER — Encounter (HOSPITAL_BASED_OUTPATIENT_CLINIC_OR_DEPARTMENT_OTHER): Payer: Self-pay | Admitting: Family Medicine

## 2023-07-02 NOTE — Progress Notes (Signed)
 Hi Kimberly Dennis,  Are you still taking your vitamin D supplement?  Are you also taking this with calcium?  Your vitamin D has not improved with the supplement that Christina prescribed you in the past 3 months.  Please let us know.  She also wanted you to make an appointment with our office for a follow-up for mood and ongoing chronic issues.  Please call the office to make this appointment

## 2023-08-16 ENCOUNTER — Encounter (HOSPITAL_COMMUNITY): Payer: Self-pay | Admitting: *Deleted

## 2023-08-20 ENCOUNTER — Encounter: Payer: Self-pay | Admitting: Family Medicine

## 2024-05-14 ENCOUNTER — Ambulatory Visit (INDEPENDENT_AMBULATORY_CARE_PROVIDER_SITE_OTHER): Admitting: Family Medicine

## 2024-05-14 ENCOUNTER — Encounter (HOSPITAL_BASED_OUTPATIENT_CLINIC_OR_DEPARTMENT_OTHER): Payer: Self-pay | Admitting: Family Medicine

## 2024-05-14 DIAGNOSIS — L568 Other specified acute skin changes due to ultraviolet radiation: Secondary | ICD-10-CM | POA: Insufficient documentation

## 2024-05-14 DIAGNOSIS — N898 Other specified noninflammatory disorders of vagina: Secondary | ICD-10-CM | POA: Diagnosis not present

## 2024-05-14 DIAGNOSIS — K219 Gastro-esophageal reflux disease without esophagitis: Secondary | ICD-10-CM | POA: Insufficient documentation

## 2024-05-14 DIAGNOSIS — F5101 Primary insomnia: Secondary | ICD-10-CM | POA: Diagnosis not present

## 2024-05-14 LAB — COMPREHENSIVE METABOLIC PANEL WITH GFR
ALT: 10 IU/L (ref 0–32)
AST: 15 IU/L (ref 0–40)
Albumin: 4.5 g/dL (ref 3.9–4.9)
Alkaline Phosphatase: 67 IU/L (ref 41–116)
BUN/Creatinine Ratio: 19 (ref 9–23)
BUN: 13 mg/dL (ref 6–20)
Bilirubin Total: 0.4 mg/dL (ref 0.0–1.2)
CO2: 21 mmol/L (ref 20–29)
Calcium: 9.5 mg/dL (ref 8.7–10.2)
Chloride: 103 mmol/L (ref 96–106)
Creatinine, Ser: 0.67 mg/dL (ref 0.57–1.00)
Globulin, Total: 2.8 g/dL (ref 1.5–4.5)
Glucose: 86 mg/dL (ref 70–99)
Potassium: 4.3 mmol/L (ref 3.5–5.2)
Sodium: 137 mmol/L (ref 134–144)
Total Protein: 7.3 g/dL (ref 6.0–8.5)
eGFR: 114 mL/min/1.73

## 2024-05-14 LAB — HEMOGLOBIN A1C
Est. average glucose Bld gHb Est-mCnc: 94 mg/dL
Hgb A1c MFr Bld: 4.9 % (ref 4.8–5.6)

## 2024-05-14 LAB — TSH: TSH: 8.63 u[IU]/mL — ABNORMAL HIGH (ref 0.450–4.500)

## 2024-05-14 MED ORDER — ZOLPIDEM TARTRATE 5 MG PO TABS: 5.0000 mg | ORAL_TABLET | Freq: Every evening | ORAL | 2 refills | Status: AC | PRN

## 2024-05-14 MED ORDER — OMEPRAZOLE 40 MG PO CPDR: 40.0000 mg | DELAYED_RELEASE_CAPSULE | Freq: Every day | ORAL | 5 refills | Status: AC

## 2024-05-14 MED ORDER — WEGOVY 0.25 MG/0.5ML ~~LOC~~ SOAJ: 0.2500 mg | SUBCUTANEOUS | 0 refills | Status: AC

## 2024-05-14 NOTE — Progress Notes (Signed)
 "    Subjective:   Kimberly Dennis 19-Sep-1985 05/14/2024  Chief Complaint  Patient presents with   Medical Management of Chronic Issues    Discussed the use of AI scribe software for clinical note transcription with the patient, who gave verbal consent to proceed.  History of Present Illness Kimberly Dennis is a 39 year old female who presents with concerns about weight management.  She has experienced weight gain since her pregnancy, despite having undergone gastric sleeve surgery in 2019, which initially facilitated weight loss. Her weight fluctuates between 225 and 240 pounds, and she is unable to reduce it further. She is interested in trying Wegovy  for weight management, but her insurance requires a medical necessity form.  There is no personal history of diabetes or prediabetes, nor did she experience gestational diabetes during her pregnancy. However, her family history is significant for diabetes, with her mother and paternal grandmother both being diabetic. She was previously diagnosed with hypothyroidism before her gastric sleeve surgery, but this has not been re-evaluated recently.  She has been experiencing vaginal dryness since having her baby, though it is not associated with pain, bleeding, spotting, or discharge. She does not have a current OBGYN and last had a PAP smear in 2023 during her pregnancy. Last OBGYN was [facility]   She has been using Ambien  for about a year and a half to manage insomnia, specifically difficulty staying asleep. She takes it as needed, particularly when she has to work the next day, and has not refilled it since October 2024. She also takes omeprazole  for acid reflux and needs a refill.     The following portions of the patient's history were reviewed and updated as appropriate: past medical history, past surgical history, family history, social history, allergies, medications, and problem list.   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   Gastroesophageal reflux disease 05/14/2024   Primary insomnia 05/14/2024   Vaginal dryness 05/14/2024   History of cesarean delivery 06/30/2021   Morbid obesity (HCC) 01/20/2018   Past Medical History:  Diagnosis Date   Anemia    Anxiety    Depression    GERD (gastroesophageal reflux disease)    HPV (human papilloma virus) infection    Hypothyroidism    Obesity    Thyromegaly    Vaginal Pap smear, abnormal    Past Surgical History:  Procedure Laterality Date   CESAREAN SECTION     CESAREAN SECTION WITH BILATERAL TUBAL LIGATION N/A 06/30/2021   Procedure: CESAREAN SECTION WITH BILATERAL SALPINGECTOMY;  Surgeon: Storm Setter, DO;  Location: [facility];  Service: Obstetrics;  Laterality: N/A;   LAPAROSCOPIC GASTRIC SLEEVE RESECTION N/A 01/20/2018   Procedure: LAPAROSCOPIC GASTRIC SLEEVE RESECTION WITH UPPER ENDO AND ERAS PATHWAY;  Surgeon: Kinsinger, Herlene Righter, MD;  Location: [facility];  Service: General;  Laterality: N/A;   TUBAL LIGATION     Family History  Problem Relation Age of Onset   Hypertension Mother    Diabetes Paternal Grandmother    Outpatient Medications Prior to Visit  Medication Sig Dispense Refill   acetaminophen  (TYLENOL ) 500 MG tablet Take 2 tablets (1,000 mg total) by mouth every 6 (six) hours as needed for moderate pain, headache or mild pain. 120 tablet 0   buPROPion  (WELLBUTRIN  XL) 300 MG 24 hr tablet Take 1 tablet (300 mg total) by mouth daily. 90 tablet 2   Multiple Vitamin (MULTIVITAMIN WITH MINERALS) TABS tablet Take 1 tablet by mouth  daily.     Vitamin D , Ergocalciferol , (DRISDOL ) 1.25 MG (50000 UNIT) CAPS capsule Take 1 capsule (50,000 Units total) by mouth every 7 (seven) days. 8 capsule 0   Semaglutide -Weight Management (WEGOVY ) 0.25 MG/0.5ML SOAJ Inject 0.25 mg into the skin once a week. 2 mL 2   zolpidem  (AMBIEN ) 5 MG tablet Take 1 tablet (5 mg total) by mouth at bedtime as needed. 30  tablet 0   No facility-administered medications prior to visit.   Allergies[1]   ROS: A complete ROS was performed with pertinent positives/negatives noted in the HPI. The remainder of the ROS are negative.    Objective:   Today's Vitals   05/14/24 1332  BP: 118/79  Pulse: 63  SpO2: 100%  Weight: 239 lb 4.8 oz (108.5 kg)  Height: 5' 6 (1.676 m)  PainSc: 0-No pain    Physical Exam   GENERAL: Well-appearing, in NAD. Well nourished.  SKIN: Pink, warm and dry.  Head: Normocephalic. NECK: Trachea midline. Full ROM w/o pain or tenderness.  RESPIRATORY: Chest wall symmetrical. Respirations even and non-labored. Breath sounds clear to auscultation bilaterally.  CARDIAC: S1, S2 present, regular rate and rhythm without murmur or gallops. Peripheral pulses 2+ bilaterally.  MSK: Muscle tone and strength appropriate for age.  NEUROLOGIC: No motor or sensory deficits. Steady, even gait. C2-C12 intact.  PSYCH/MENTAL STATUS: Alert, oriented x 3. Cooperative, appropriate mood and affect.       Assessment & Plan:  1. Morbid obesity (HCC) (Primary) Discussed dietary and exercise recommendations that patient has tried. Will start wegovy  0.25mg  as directed and reviewed all risks with patient. Will obtain fasting labs today and start pending labs. Follow up in 3-4 months.  - Comprehensive metabolic panel with GFR - TSH - Hemoglobin A1c - semaglutide -weight management (WEGOVY ) 0.25 MG/0.5ML SOAJ SQ injection; Inject 0.25 mg into the skin once a week. Use this dose for 1 month (4 shots) and then increase to next higher dose.  Dispense: 2 mL; Refill: 0  2. Gastroesophageal reflux disease, unspecified whether esophagitis present Omeprazole  refilled. Gerd controlled per patient.   3. Primary insomnia Pt uses Ambien  5mg  PRN for intermittent insomnia. PDMP reviewed and refill placed. Safe use reviewd.   4. Vaginal dryness Declines need for pelvic exam and denies urinary symptoms, bleeding, or  discharge. Recommend trial of Replens and follow up in 1-2 weeks if no improvement.    Meds ordered this encounter  Medications   semaglutide -weight management (WEGOVY ) 0.25 MG/0.5ML SOAJ SQ injection    Sig: Inject 0.25 mg into the skin once a week. Use this dose for 1 month (4 shots) and then increase to next higher dose.    Dispense:  2 mL    Refill:  0    Supervising Provider:   DE CUBA, RAYMOND J [[id number]]   zolpidem  (AMBIEN ) 5 MG tablet    Sig: Take 1 tablet (5 mg total) by mouth at bedtime as needed.    Dispense:  30 tablet    Refill:  2    Supervising Provider:   DE CUBA, RAYMOND J [[id number]]   omeprazole  (PRILOSEC) 40 MG capsule    Sig: Take 1 capsule (40 mg total) by mouth daily.    Dispense:  30 capsule    Refill:  5    Supervising Provider:   DE CUBA, RAYMOND J [[id number]]   Lab Orders         Comprehensive metabolic panel with GFR         TSH  Hemoglobin A1c     No images are attached to the encounter or orders placed in the encounter.  Return in about 4 months (around 09/11/2024) for ANNUAL PHYSICAL, Weight Check Up .    Patient to reach out to office if new, worrisome, or unresolved symptoms arise or if no improvement in patient's condition. Patient verbalized understanding and is agreeable to treatment plan. All questions answered to patient's satisfaction.    Thersia Schuyler Stark, FNP    [1] No Known Allergies  "

## 2024-05-14 NOTE — Patient Instructions (Addendum)
Replens Vaginal Moisturizer

## 2024-05-17 ENCOUNTER — Encounter (HOSPITAL_BASED_OUTPATIENT_CLINIC_OR_DEPARTMENT_OTHER): Payer: Self-pay | Admitting: Family Medicine

## 2024-05-17 ENCOUNTER — Ambulatory Visit (HOSPITAL_BASED_OUTPATIENT_CLINIC_OR_DEPARTMENT_OTHER): Payer: Self-pay | Admitting: Family Medicine

## 2024-05-17 DIAGNOSIS — R7989 Other specified abnormal findings of blood chemistry: Secondary | ICD-10-CM

## 2024-05-17 NOTE — Progress Notes (Signed)
 Hi Kimberly Dennis,  Your A1C, CMP are within normal range without signs of prediabetes/diabetes. Your TSH (thyroid stimulating hormone) is slightly elevated. TSH can fluctuate and I would recommend rechecking this in 2-3 weeks. Please call the office to schedule a lab only appointment.

## 2024-05-18 NOTE — Telephone Encounter (Signed)
 Please see mychart sent by pt and advise if we might be able to do an authorization to get the Wegovy  approved or if there might be another medication that insurance prefers.

## 2024-05-19 ENCOUNTER — Telehealth (HOSPITAL_BASED_OUTPATIENT_CLINIC_OR_DEPARTMENT_OTHER): Payer: Self-pay

## 2024-05-19 ENCOUNTER — Other Ambulatory Visit (HOSPITAL_COMMUNITY): Payer: Self-pay

## 2024-05-19 NOTE — Telephone Encounter (Signed)
 Pharmacy Patient Advocate Encounter   Received notification from Patient Advice Request messages that prior authorization for Wegovy  0.25mg /0.73ml is required/requested.   Insurance verification completed.   The patient is insured through [employer].   Per test claim: PA started via CoverMyMeds, Key: [id number]. PA cancelled due to drug not covered by plan, may be excluded from the patient's benefit.    There is a self pay option for Wegovy  where the patient can pay as low as $199 for the first 2 months and then it would increase to $349. The patient may want to check with her plan to see if they will pay for any weight loss meds.

## 2024-05-19 NOTE — Telephone Encounter (Signed)
 Routing to PCP for review.

## 2024-05-19 NOTE — Telephone Encounter (Signed)
 Information sent to pt in message.

## 2024-09-16 ENCOUNTER — Encounter (HOSPITAL_BASED_OUTPATIENT_CLINIC_OR_DEPARTMENT_OTHER): Admitting: Family Medicine
